# Patient Record
Sex: Male | Born: 1970 | Race: Black or African American | Hispanic: No | Smoking: Current every day smoker
Health system: Southern US, Community
[De-identification: ages and names within clinical notes are randomized; demographics above are authoritative.]

## PROBLEM LIST (undated history)

## (undated) DIAGNOSIS — M545 Low back pain: Secondary | ICD-10-CM

## (undated) DIAGNOSIS — B353 Tinea pedis: Secondary | ICD-10-CM

## (undated) DIAGNOSIS — R7989 Other specified abnormal findings of blood chemistry: Secondary | ICD-10-CM

## (undated) DIAGNOSIS — I10 Essential (primary) hypertension: Secondary | ICD-10-CM

## (undated) DIAGNOSIS — K625 Hemorrhage of anus and rectum: Secondary | ICD-10-CM

## (undated) DIAGNOSIS — F329 Major depressive disorder, single episode, unspecified: Secondary | ICD-10-CM

## (undated) DIAGNOSIS — F32A Depression, unspecified: Secondary | ICD-10-CM

## (undated) DIAGNOSIS — B20 Human immunodeficiency virus [HIV] disease: Secondary | ICD-10-CM

## (undated) DIAGNOSIS — R509 Fever, unspecified: Principal | ICD-10-CM

## (undated) DIAGNOSIS — H538 Other visual disturbances: Secondary | ICD-10-CM

## (undated) DIAGNOSIS — Z21 Asymptomatic human immunodeficiency virus [HIV] infection status: Secondary | ICD-10-CM

## (undated) HISTORY — DX: Asymptomatic human immunodeficiency virus (hiv) infection status: Z21

## (undated) HISTORY — DX: Tinea pedis: B35.3

## (undated) HISTORY — DX: Hemorrhage of anus and rectum: K62.5

## (undated) HISTORY — DX: Depression, unspecified: F32.A

## (undated) HISTORY — DX: Low back pain: M54.5

## (undated) HISTORY — DX: Fever, unspecified: R50.9

## (undated) HISTORY — DX: Human immunodeficiency virus (HIV) disease: B20

## (undated) HISTORY — DX: Essential (primary) hypertension: I10

## (undated) HISTORY — DX: Major depressive disorder, single episode, unspecified: F32.9

## (undated) HISTORY — DX: Other specified abnormal findings of blood chemistry: R79.89

## (undated) HISTORY — DX: Other visual disturbances: H53.8

---

## 1998-07-19 ENCOUNTER — Encounter: Payer: Self-pay | Admitting: Emergency Medicine

## 1998-07-19 ENCOUNTER — Emergency Department (HOSPITAL_COMMUNITY): Admission: EM | Admit: 1998-07-19 | Discharge: 1998-07-19 | Payer: Self-pay | Admitting: Emergency Medicine

## 2000-08-17 ENCOUNTER — Emergency Department (HOSPITAL_COMMUNITY): Admission: EM | Admit: 2000-08-17 | Discharge: 2000-08-17 | Payer: Self-pay | Admitting: Emergency Medicine

## 2001-01-10 ENCOUNTER — Emergency Department (HOSPITAL_COMMUNITY): Admission: EM | Admit: 2001-01-10 | Discharge: 2001-01-10 | Payer: Self-pay | Admitting: Emergency Medicine

## 2002-11-03 ENCOUNTER — Encounter: Payer: Self-pay | Admitting: Emergency Medicine

## 2002-11-03 ENCOUNTER — Emergency Department (HOSPITAL_COMMUNITY): Admission: EM | Admit: 2002-11-03 | Discharge: 2002-11-03 | Payer: Self-pay | Admitting: Emergency Medicine

## 2002-11-14 ENCOUNTER — Emergency Department (HOSPITAL_COMMUNITY): Admission: EM | Admit: 2002-11-14 | Discharge: 2002-11-14 | Payer: Self-pay | Admitting: Emergency Medicine

## 2004-02-16 DIAGNOSIS — B029 Zoster without complications: Secondary | ICD-10-CM | POA: Insufficient documentation

## 2004-02-28 ENCOUNTER — Encounter: Admission: RE | Admit: 2004-02-28 | Discharge: 2004-02-28 | Payer: Self-pay | Admitting: Internal Medicine

## 2004-08-18 DIAGNOSIS — I319 Disease of pericardium, unspecified: Secondary | ICD-10-CM | POA: Insufficient documentation

## 2004-10-20 ENCOUNTER — Ambulatory Visit (HOSPITAL_COMMUNITY): Admission: RE | Admit: 2004-10-20 | Discharge: 2004-10-20 | Payer: Self-pay | Admitting: Internal Medicine

## 2004-10-20 ENCOUNTER — Ambulatory Visit: Payer: Self-pay | Admitting: Internal Medicine

## 2004-10-20 ENCOUNTER — Encounter (INDEPENDENT_AMBULATORY_CARE_PROVIDER_SITE_OTHER): Payer: Self-pay | Admitting: *Deleted

## 2004-10-20 LAB — CONVERTED CEMR LAB
CD4 Count: 200 microliters
CD4 T Cell Abs: 200

## 2004-10-22 ENCOUNTER — Ambulatory Visit: Payer: Self-pay | Admitting: Internal Medicine

## 2004-10-29 ENCOUNTER — Ambulatory Visit: Payer: Self-pay | Admitting: Internal Medicine

## 2004-11-05 ENCOUNTER — Ambulatory Visit: Payer: Self-pay | Admitting: Infectious Diseases

## 2004-11-09 ENCOUNTER — Ambulatory Visit: Payer: Self-pay | Admitting: Internal Medicine

## 2004-12-02 ENCOUNTER — Inpatient Hospital Stay (HOSPITAL_COMMUNITY): Admission: AD | Admit: 2004-12-02 | Discharge: 2004-12-03 | Payer: Self-pay | Admitting: Internal Medicine

## 2004-12-02 ENCOUNTER — Ambulatory Visit: Payer: Self-pay | Admitting: Internal Medicine

## 2004-12-03 ENCOUNTER — Encounter (INDEPENDENT_AMBULATORY_CARE_PROVIDER_SITE_OTHER): Payer: Self-pay | Admitting: *Deleted

## 2004-12-03 ENCOUNTER — Encounter (INDEPENDENT_AMBULATORY_CARE_PROVIDER_SITE_OTHER): Payer: Self-pay | Admitting: Interventional Radiology

## 2004-12-21 ENCOUNTER — Ambulatory Visit: Payer: Self-pay | Admitting: Internal Medicine

## 2004-12-24 ENCOUNTER — Ambulatory Visit: Payer: Self-pay

## 2005-01-04 ENCOUNTER — Ambulatory Visit: Payer: Self-pay | Admitting: Infectious Diseases

## 2005-01-08 ENCOUNTER — Ambulatory Visit: Payer: Self-pay | Admitting: Infectious Diseases

## 2005-01-12 ENCOUNTER — Ambulatory Visit: Payer: Self-pay | Admitting: Infectious Diseases

## 2005-01-12 ENCOUNTER — Encounter (INDEPENDENT_AMBULATORY_CARE_PROVIDER_SITE_OTHER): Payer: Self-pay | Admitting: *Deleted

## 2005-01-12 LAB — CONVERTED CEMR LAB: HIV 1 RNA Quant: 25665 copies/mL

## 2005-01-19 ENCOUNTER — Ambulatory Visit: Payer: Self-pay | Admitting: Infectious Diseases

## 2005-01-22 ENCOUNTER — Ambulatory Visit: Payer: Self-pay | Admitting: Internal Medicine

## 2005-01-22 ENCOUNTER — Ambulatory Visit: Payer: Self-pay | Admitting: Infectious Diseases

## 2005-01-27 ENCOUNTER — Ambulatory Visit: Payer: Self-pay | Admitting: Infectious Diseases

## 2005-02-03 ENCOUNTER — Ambulatory Visit: Payer: Self-pay | Admitting: Infectious Diseases

## 2005-02-11 ENCOUNTER — Ambulatory Visit (HOSPITAL_COMMUNITY): Admission: RE | Admit: 2005-02-11 | Discharge: 2005-02-11 | Payer: Self-pay | Admitting: Infectious Diseases

## 2005-02-11 ENCOUNTER — Ambulatory Visit: Payer: Self-pay | Admitting: Infectious Diseases

## 2005-03-03 ENCOUNTER — Ambulatory Visit: Payer: Self-pay | Admitting: Infectious Diseases

## 2005-04-07 ENCOUNTER — Ambulatory Visit: Payer: Self-pay | Admitting: Infectious Diseases

## 2005-04-09 ENCOUNTER — Ambulatory Visit (HOSPITAL_COMMUNITY): Admission: RE | Admit: 2005-04-09 | Discharge: 2005-04-09 | Payer: Self-pay | Admitting: Infectious Diseases

## 2005-04-26 ENCOUNTER — Ambulatory Visit: Payer: Self-pay | Admitting: Infectious Diseases

## 2005-06-29 ENCOUNTER — Ambulatory Visit: Payer: Self-pay | Admitting: Infectious Diseases

## 2005-07-08 ENCOUNTER — Ambulatory Visit: Payer: Self-pay | Admitting: Infectious Diseases

## 2005-09-20 ENCOUNTER — Ambulatory Visit: Payer: Self-pay | Admitting: Infectious Diseases

## 2005-11-08 ENCOUNTER — Ambulatory Visit: Payer: Self-pay | Admitting: Infectious Diseases

## 2005-12-14 ENCOUNTER — Ambulatory Visit: Payer: Self-pay | Admitting: Infectious Diseases

## 2005-12-20 ENCOUNTER — Ambulatory Visit (HOSPITAL_COMMUNITY): Admission: RE | Admit: 2005-12-20 | Discharge: 2005-12-20 | Payer: Self-pay | Admitting: Infectious Diseases

## 2005-12-24 ENCOUNTER — Ambulatory Visit: Payer: Self-pay | Admitting: Infectious Diseases

## 2006-02-24 ENCOUNTER — Ambulatory Visit: Payer: Self-pay | Admitting: Infectious Diseases

## 2006-03-02 ENCOUNTER — Ambulatory Visit: Payer: Self-pay | Admitting: Infectious Diseases

## 2006-05-31 ENCOUNTER — Encounter (INDEPENDENT_AMBULATORY_CARE_PROVIDER_SITE_OTHER): Payer: Self-pay | Admitting: *Deleted

## 2006-05-31 ENCOUNTER — Ambulatory Visit: Payer: Self-pay | Admitting: Infectious Diseases

## 2006-06-30 ENCOUNTER — Ambulatory Visit: Payer: Self-pay | Admitting: Infectious Diseases

## 2006-08-22 ENCOUNTER — Encounter (INDEPENDENT_AMBULATORY_CARE_PROVIDER_SITE_OTHER): Payer: Self-pay | Admitting: *Deleted

## 2006-08-22 ENCOUNTER — Ambulatory Visit: Payer: Self-pay | Admitting: Infectious Diseases

## 2006-08-22 LAB — CONVERTED CEMR LAB
ALT: 32 units/L (ref 0–53)
BUN: 14 mg/dL (ref 6–23)
CD4 Count: 400 microliters
CO2: 25 meq/L (ref 19–32)
Cholesterol: 214 mg/dL — ABNORMAL HIGH (ref 0–200)
Creatinine, Ser: 0.89 mg/dL (ref 0.40–1.50)
Glucose, Bld: 94 mg/dL (ref 70–99)
HDL: 74 mg/dL (ref >39)
Indirect Bilirubin: 0.5 mg/dL (ref 0.0–0.9)
LDL Cholesterol: 118 mg/dL — ABNORMAL HIGH (ref 0–99)
Potassium: 4.1 meq/L (ref 3.5–5.3)
Triglycerides: 112 mg/dL (ref <150)

## 2006-11-08 DIAGNOSIS — L2089 Other atopic dermatitis: Secondary | ICD-10-CM

## 2006-11-08 DIAGNOSIS — R599 Enlarged lymph nodes, unspecified: Secondary | ICD-10-CM | POA: Insufficient documentation

## 2006-11-08 DIAGNOSIS — J329 Chronic sinusitis, unspecified: Secondary | ICD-10-CM | POA: Insufficient documentation

## 2006-11-08 DIAGNOSIS — B20 Human immunodeficiency virus [HIV] disease: Secondary | ICD-10-CM

## 2006-11-08 DIAGNOSIS — B159 Hepatitis A without hepatic coma: Secondary | ICD-10-CM | POA: Insufficient documentation

## 2006-11-08 DIAGNOSIS — D649 Anemia, unspecified: Secondary | ICD-10-CM

## 2006-11-17 ENCOUNTER — Ambulatory Visit: Payer: Self-pay | Admitting: Infectious Diseases

## 2006-11-17 ENCOUNTER — Encounter: Payer: Self-pay | Admitting: Infectious Diseases

## 2006-11-17 LAB — CONVERTED CEMR LAB
ALT: 97 units/L — ABNORMAL HIGH (ref 0–53)
AST: 66 units/L — ABNORMAL HIGH (ref 0–37)
Alkaline Phosphatase: 214 units/L — ABNORMAL HIGH (ref 39–117)
Bilirubin Urine: NEGATIVE
Bilirubin, Direct: 0.1 mg/dL (ref 0.0–0.3)
Calcium: 8.6 mg/dL (ref 8.4–10.5)
Chloride: 103 meq/L (ref 96–112)
Cholesterol: 228 mg/dL — ABNORMAL HIGH (ref 0–200)
Creatinine, Urine: 182.8 mg/dL
Glucose, Bld: 104 mg/dL — ABNORMAL HIGH (ref 70–99)
Hemoglobin, Urine: NEGATIVE
Hep A Total Ab: POSITIVE — AB
Indirect Bilirubin: 0.3 mg/dL (ref 0.0–0.9)
Ketones, ur: NEGATIVE mg/dL
LDL Cholesterol: 125 mg/dL — ABNORMAL HIGH (ref 0–99)
Nitrite: NEGATIVE
Sodium: 140 meq/L (ref 135–145)
Specific Gravity, Urine: 1.018 (ref 1.005–1.03)
Total Bilirubin: 0.4 mg/dL (ref 0.3–1.2)
Total CHOL/HDL Ratio: 3
Total Protein: 8.1 g/dL (ref 6.0–8.3)
Urine Glucose: NEGATIVE mg/dL
VLDL: 27 mg/dL (ref 0–40)

## 2006-12-12 ENCOUNTER — Encounter (INDEPENDENT_AMBULATORY_CARE_PROVIDER_SITE_OTHER): Payer: Self-pay | Admitting: *Deleted

## 2006-12-12 ENCOUNTER — Encounter: Payer: Self-pay | Admitting: Infectious Diseases

## 2006-12-12 LAB — CONVERTED CEMR LAB

## 2006-12-25 ENCOUNTER — Encounter (INDEPENDENT_AMBULATORY_CARE_PROVIDER_SITE_OTHER): Payer: Self-pay | Admitting: *Deleted

## 2007-02-07 ENCOUNTER — Ambulatory Visit: Payer: Self-pay | Admitting: Infectious Diseases

## 2007-03-15 ENCOUNTER — Ambulatory Visit: Payer: Self-pay | Admitting: Internal Medicine

## 2007-05-02 ENCOUNTER — Encounter: Payer: Self-pay | Admitting: Infectious Diseases

## 2007-05-02 ENCOUNTER — Ambulatory Visit: Payer: Self-pay | Admitting: Infectious Diseases

## 2007-05-02 LAB — CONVERTED CEMR LAB
ALT: 43 units/L (ref 0–53)
AST: 24 units/L (ref 0–37)
Bilirubin, Direct: 0.1 mg/dL (ref 0.0–0.3)
CO2: 21 meq/L (ref 19–32)
Creatinine, Ser: 0.78 mg/dL (ref 0.40–1.50)
Glucose, Bld: 94 mg/dL (ref 70–99)
HDL: 56 mg/dL (ref >39)
Hemoglobin, Urine: NEGATIVE
Indirect Bilirubin: 0.6 mg/dL (ref 0.0–0.9)
Ketones, ur: NEGATIVE mg/dL
Leukocytes, UA: NEGATIVE
Phosphorus: 3.2 mg/dL (ref 2.3–4.6)
Potassium: 3.9 meq/L (ref 3.5–5.3)
Protein, ur: NEGATIVE mg/dL
Total Protein: 7.6 g/dL (ref 6.0–8.3)
Urine Glucose: NEGATIVE mg/dL

## 2007-07-27 ENCOUNTER — Ambulatory Visit: Payer: Self-pay | Admitting: Infectious Diseases

## 2007-10-04 ENCOUNTER — Ambulatory Visit: Payer: Self-pay | Admitting: Infectious Diseases

## 2007-10-06 ENCOUNTER — Encounter: Payer: Self-pay | Admitting: Infectious Diseases

## 2008-01-08 ENCOUNTER — Ambulatory Visit: Payer: Self-pay | Admitting: Infectious Diseases

## 2008-02-29 ENCOUNTER — Ambulatory Visit: Payer: Self-pay | Admitting: Infectious Diseases

## 2008-04-08 ENCOUNTER — Ambulatory Visit: Payer: Self-pay | Admitting: Infectious Diseases

## 2008-06-28 ENCOUNTER — Ambulatory Visit: Payer: Self-pay | Admitting: Infectious Diseases

## 2008-07-04 ENCOUNTER — Ambulatory Visit: Payer: Self-pay | Admitting: Infectious Diseases

## 2008-07-05 ENCOUNTER — Encounter (INDEPENDENT_AMBULATORY_CARE_PROVIDER_SITE_OTHER): Payer: Self-pay | Admitting: *Deleted

## 2008-07-24 ENCOUNTER — Encounter (INDEPENDENT_AMBULATORY_CARE_PROVIDER_SITE_OTHER): Payer: Self-pay | Admitting: *Deleted

## 2008-07-29 ENCOUNTER — Telehealth (INDEPENDENT_AMBULATORY_CARE_PROVIDER_SITE_OTHER): Payer: Self-pay | Admitting: *Deleted

## 2008-08-27 ENCOUNTER — Telehealth (INDEPENDENT_AMBULATORY_CARE_PROVIDER_SITE_OTHER): Payer: Self-pay | Admitting: *Deleted

## 2008-09-16 ENCOUNTER — Ambulatory Visit: Payer: Self-pay | Admitting: Infectious Diseases

## 2008-09-23 ENCOUNTER — Telehealth (INDEPENDENT_AMBULATORY_CARE_PROVIDER_SITE_OTHER): Payer: Self-pay | Admitting: *Deleted

## 2008-10-24 ENCOUNTER — Telehealth (INDEPENDENT_AMBULATORY_CARE_PROVIDER_SITE_OTHER): Payer: Self-pay | Admitting: *Deleted

## 2008-11-20 ENCOUNTER — Telehealth: Payer: Self-pay | Admitting: *Deleted

## 2008-11-27 ENCOUNTER — Telehealth (INDEPENDENT_AMBULATORY_CARE_PROVIDER_SITE_OTHER): Payer: Self-pay | Admitting: *Deleted

## 2008-12-12 ENCOUNTER — Ambulatory Visit: Payer: Self-pay | Admitting: Infectious Diseases

## 2008-12-24 ENCOUNTER — Encounter: Payer: Self-pay | Admitting: Infectious Diseases

## 2008-12-24 ENCOUNTER — Ambulatory Visit: Payer: Self-pay | Admitting: Infectious Diseases

## 2008-12-24 LAB — CONVERTED CEMR LAB
ALT: 52 units/L (ref 0–53)
AST: 39 units/L — ABNORMAL HIGH (ref 0–37)
CD4 Count: 389 microliters
Chloride: 109 meq/L (ref 96–112)
Creatinine, Ser: 0.83 mg/dL (ref 0.40–1.50)
HIV 1 RNA Quant: 39 copies/mL
Potassium: 4 meq/L (ref 3.5–5.3)
Sodium: 142 meq/L (ref 135–145)
Total CHOL/HDL Ratio: 3.1
Total Protein: 7.3 g/dL (ref 6.0–8.3)
Triglycerides: 95 mg/dL (ref <150)

## 2008-12-26 ENCOUNTER — Telehealth (INDEPENDENT_AMBULATORY_CARE_PROVIDER_SITE_OTHER): Payer: Self-pay | Admitting: *Deleted

## 2009-01-08 ENCOUNTER — Encounter (INDEPENDENT_AMBULATORY_CARE_PROVIDER_SITE_OTHER): Payer: Self-pay | Admitting: *Deleted

## 2009-02-13 ENCOUNTER — Telehealth (INDEPENDENT_AMBULATORY_CARE_PROVIDER_SITE_OTHER): Payer: Self-pay | Admitting: *Deleted

## 2009-03-13 ENCOUNTER — Ambulatory Visit: Payer: Self-pay | Admitting: Infectious Diseases

## 2009-03-13 LAB — CONVERTED CEMR LAB
ALT: 39 units/L (ref 0–53)
AST: 27 units/L (ref 0–37)
Albumin: 4.2 g/dL (ref 3.5–5.2)
BUN: 12 mg/dL (ref 6–23)
Basophils Relative: 1 % (ref 0–1)
CO2: 20 meq/L (ref 19–32)
Cholesterol: 217 mg/dL — ABNORMAL HIGH (ref 0–200)
Eosinophils Relative: 4 % (ref 0–5)
Glucose, Bld: 92 mg/dL (ref 70–99)
LDL Cholesterol: 112 mg/dL — ABNORMAL HIGH (ref 0–99)
MCHC: 35 g/dL (ref 30.0–36.0)
Monocytes Absolute: 0.5 10*3/uL (ref 0.1–1.0)
Neutro Abs: 2.2 10*3/uL (ref 1.7–7.7)
Neutrophils Relative %: 37 % — ABNORMAL LOW (ref 43–77)
Potassium: 4 meq/L (ref 3.5–5.3)
RDW: 12.5 % (ref 11.5–15.5)
Total Bilirubin: 0.4 mg/dL (ref 0.3–1.2)
Total Protein: 7.6 g/dL (ref 6.0–8.3)
Triglycerides: 256 mg/dL — ABNORMAL HIGH (ref <150)
VLDL: 51 mg/dL — ABNORMAL HIGH (ref 0–40)

## 2009-03-21 ENCOUNTER — Telehealth (INDEPENDENT_AMBULATORY_CARE_PROVIDER_SITE_OTHER): Payer: Self-pay | Admitting: *Deleted

## 2009-04-18 ENCOUNTER — Telehealth (INDEPENDENT_AMBULATORY_CARE_PROVIDER_SITE_OTHER): Payer: Self-pay | Admitting: *Deleted

## 2009-05-15 ENCOUNTER — Telehealth (INDEPENDENT_AMBULATORY_CARE_PROVIDER_SITE_OTHER): Payer: Self-pay | Admitting: *Deleted

## 2009-06-13 ENCOUNTER — Telehealth (INDEPENDENT_AMBULATORY_CARE_PROVIDER_SITE_OTHER): Payer: Self-pay | Admitting: *Deleted

## 2009-07-14 ENCOUNTER — Telehealth (INDEPENDENT_AMBULATORY_CARE_PROVIDER_SITE_OTHER): Payer: Self-pay | Admitting: *Deleted

## 2009-07-22 ENCOUNTER — Encounter: Payer: Self-pay | Admitting: Infectious Diseases

## 2009-07-22 ENCOUNTER — Ambulatory Visit: Payer: Self-pay | Admitting: Infectious Diseases

## 2009-07-22 LAB — CONVERTED CEMR LAB
ALT: 31 units/L (ref 0–53)
AST: 23 units/L (ref 0–37)
Alkaline Phosphatase: 124 units/L — ABNORMAL HIGH (ref 39–117)
BUN: 17 mg/dL (ref 6–23)
CO2: 19 meq/L (ref 19–32)
Creatinine, Ser: 0.91 mg/dL (ref 0.40–1.50)
Creatinine, Urine: 161.4 mg/dL
HIV 1 RNA Quant: 39 copies/mL
Total Bilirubin: 0.3 mg/dL (ref 0.3–1.2)
Total Protein, Urine: 8
Total Protein: 7.7 g/dL (ref 6.0–8.3)

## 2009-07-23 ENCOUNTER — Encounter: Payer: Self-pay | Admitting: Infectious Diseases

## 2009-07-23 LAB — CONVERTED CEMR LAB: Direct LDL: 123 mg/dL — ABNORMAL HIGH

## 2009-08-05 ENCOUNTER — Ambulatory Visit: Payer: Self-pay | Admitting: Internal Medicine

## 2009-08-11 ENCOUNTER — Telehealth (INDEPENDENT_AMBULATORY_CARE_PROVIDER_SITE_OTHER): Payer: Self-pay | Admitting: *Deleted

## 2009-08-12 ENCOUNTER — Encounter (INDEPENDENT_AMBULATORY_CARE_PROVIDER_SITE_OTHER): Payer: Self-pay | Admitting: *Deleted

## 2009-11-05 ENCOUNTER — Ambulatory Visit: Payer: Self-pay | Admitting: Infectious Diseases

## 2009-11-05 LAB — CONVERTED CEMR LAB
AST: 32 units/L (ref 0–37)
Albumin: 4.4 g/dL (ref 3.5–5.2)
Alkaline Phosphatase: 127 units/L — ABNORMAL HIGH (ref 39–117)
HIV 1 RNA Quant: 39 copies/mL
LDL Cholesterol: 136 mg/dL — ABNORMAL HIGH (ref 0–99)
Potassium: 4 meq/L (ref 3.5–5.3)
Sodium: 137 meq/L (ref 135–145)
Total Bilirubin: 0.6 mg/dL (ref 0.3–1.2)
Total Protein: 7.7 g/dL (ref 6.0–8.3)
VLDL: 38 mg/dL (ref 0–40)

## 2010-03-24 ENCOUNTER — Ambulatory Visit: Payer: Self-pay | Admitting: Internal Medicine

## 2010-03-24 ENCOUNTER — Ambulatory Visit: Payer: Self-pay | Admitting: Infectious Diseases

## 2010-03-24 LAB — CONVERTED CEMR LAB
ALT: 68 units/L — ABNORMAL HIGH (ref 0–53)
AST: 67 units/L — ABNORMAL HIGH (ref 0–37)
Alkaline Phosphatase: 156 units/L — ABNORMAL HIGH (ref 39–117)
Basophils Relative: 0 % (ref 0–1)
CO2: 18 meq/L — ABNORMAL LOW (ref 19–32)
Creatinine, Ser: 0.76 mg/dL (ref 0.40–1.50)
Eosinophils Absolute: 0.2 10*3/uL (ref 0.0–0.7)
HIV 1 RNA Quant: <48 copies/mL (ref <48)
HIV-1 RNA Quant, Log: <1.68 (ref <1.68)
LDL Cholesterol: 131 mg/dL — ABNORMAL HIGH (ref 0–99)
Lymphs Abs: 2.3 10*3/uL (ref 0.7–4.0)
MCHC: 34.8 g/dL (ref 30.0–36.0)
MCV: 98.4 fL (ref 78.0–100.0)
Monocytes Relative: 11 % (ref 3–12)
Neutro Abs: 1.1 10*3/uL — ABNORMAL LOW (ref 1.7–7.7)
Neutrophils Relative %: 28 % — ABNORMAL LOW (ref 43–77)
Platelets: 247 10*3/uL (ref 150–400)
RBC: 4.35 M/uL (ref 4.22–5.81)
Sodium: 138 meq/L (ref 135–145)
Total Bilirubin: 0.4 mg/dL (ref 0.3–1.2)
Total CHOL/HDL Ratio: 4.1
Total Protein: 7.9 g/dL (ref 6.0–8.3)
VLDL: 53 mg/dL — ABNORMAL HIGH (ref 0–40)
WBC: 4.1 10*3/uL (ref 4.0–10.5)

## 2010-04-13 ENCOUNTER — Encounter (INDEPENDENT_AMBULATORY_CARE_PROVIDER_SITE_OTHER): Payer: Self-pay | Admitting: *Deleted

## 2010-04-14 ENCOUNTER — Telehealth (INDEPENDENT_AMBULATORY_CARE_PROVIDER_SITE_OTHER): Payer: Self-pay | Admitting: *Deleted

## 2010-07-22 ENCOUNTER — Ambulatory Visit: Payer: Self-pay | Admitting: Infectious Diseases

## 2010-07-22 ENCOUNTER — Ambulatory Visit: Payer: Self-pay | Admitting: Internal Medicine

## 2010-07-22 LAB — CONVERTED CEMR LAB
ALT: 34 units/L (ref 0–53)
AST: 25 units/L (ref 0–37)
Alkaline Phosphatase: 108 units/L (ref 39–117)
CO2: 24 meq/L (ref 19–32)
Cholesterol: 214 mg/dL — ABNORMAL HIGH (ref 0–200)
Creatinine, Ser: 0.89 mg/dL (ref 0.40–1.50)
Hep B Core Total Ab: NEGATIVE
LDL Cholesterol: 128 mg/dL — ABNORMAL HIGH (ref 0–99)
Sodium: 139 meq/L (ref 135–145)
Total Bilirubin: 0.5 mg/dL (ref 0.3–1.2)
Total CHOL/HDL Ratio: 3.7
Total Protein, Urine: 6
Total Protein: 7.3 g/dL (ref 6.0–8.3)
VLDL: 28 mg/dL (ref 0–40)

## 2010-07-24 ENCOUNTER — Ambulatory Visit: Payer: Self-pay | Admitting: Internal Medicine

## 2010-07-24 DIAGNOSIS — K921 Melena: Secondary | ICD-10-CM | POA: Insufficient documentation

## 2010-09-08 ENCOUNTER — Encounter (INDEPENDENT_AMBULATORY_CARE_PROVIDER_SITE_OTHER): Payer: Self-pay | Admitting: *Deleted

## 2010-10-28 ENCOUNTER — Ambulatory Visit
Admission: RE | Admit: 2010-10-28 | Discharge: 2010-10-28 | Payer: Self-pay | Source: Home / Self Care | Attending: Infectious Diseases | Admitting: Infectious Diseases

## 2010-10-28 ENCOUNTER — Encounter: Payer: Self-pay | Admitting: Infectious Diseases

## 2010-10-28 LAB — CONVERTED CEMR LAB
ALT: 36 U/L (ref 0–53)
AST: 30 U/L (ref 0–37)
Albumin: 4.6 g/dL (ref 3.5–5.2)
Alkaline Phosphatase: 138 U/L — ABNORMAL HIGH (ref 39–117)
BUN: 11 mg/dL (ref 6–23)
CD4 Count: 596 uL
CO2: 25 meq/L (ref 19–32)
Calcium: 9.5 mg/dL (ref 8.4–10.5)
Chloride: 105 meq/L (ref 96–112)
Cholesterol: 212 mg/dL — ABNORMAL HIGH (ref 0–200)
Creatinine, Ser: 0.88 mg/dL (ref 0.40–1.50)
Glucose, Bld: 99 mg/dL (ref 70–99)
HDL: 58 mg/dL (ref >39)
HIV 1 RNA Quant: 39 {copies}/mL
LDL Cholesterol: 124 mg/dL — ABNORMAL HIGH (ref 0–99)
Potassium: 4 meq/L (ref 3.5–5.3)
Sodium: 140 meq/L (ref 135–145)
Total Bilirubin: 0.5 mg/dL (ref 0.3–1.2)
Total CHOL/HDL Ratio: 3.7
Total Protein: 7.5 g/dL (ref 6.0–8.3)
Triglycerides: 149 mg/dL (ref <150)
VLDL: 30 mg/dL (ref 0–40)

## 2010-11-15 LAB — CONVERTED CEMR LAB
ALT: 35 U/L
ALT: 39 U/L
ALT: 45 U/L
ALT: 51 units/L (ref 0–53)
AST: 20 U/L
AST: 27 units/L (ref 0–37)
AST: 30 U/L
AST: 37 U/L
AST: 37 units/L (ref 0–37)
Albumin: 4 g/dL (ref 3.5–5.2)
Albumin: 4.2 g/dL (ref 3.5–5.2)
Albumin: 4.3 g/dL
Albumin: 4.3 g/dL
Albumin: 4.5 g/dL
Alkaline Phosphatase: 109 units/L (ref 39–117)
Alkaline Phosphatase: 111 units/L (ref 39–117)
Alkaline Phosphatase: 115 U/L
Alkaline Phosphatase: 128 U/L — ABNORMAL HIGH
Alkaline Phosphatase: 136 U/L — ABNORMAL HIGH
BUN: 11 mg/dL
BUN: 11 mg/dL
BUN: 11 mg/dL (ref 6–23)
BUN: 16 mg/dL
Bilirubin Urine: NEGATIVE
Bilirubin Urine: NEGATIVE
Bilirubin, Direct: 0.1 mg/dL
Bilirubin, Direct: 0.1 mg/dL
Bilirubin, Direct: 0.2 mg/dL (ref 0.0–0.3)
Bilirubin, Direct: <0.1 mg/dL
CD4 Count: 344 uL
CD4 Count: 432 uL
CD4 Count: 443 microliters
CD4 Count: 444 uL
CD4 Count: 486 uL
CD4 Count: 578 uL
CO2: 21 meq/L (ref 19–32)
CO2: 23 meq/L
CO2: 23 meq/L
CO2: 23 meq/L
Calcium: 8.7 mg/dL (ref 8.4–10.5)
Calcium: 8.8 mg/dL (ref 8.4–10.5)
Calcium: 8.9 mg/dL
Calcium: 9.1 mg/dL
Calcium: 9.2 mg/dL
Calcium: 9.3 mg/dL (ref 8.4–10.5)
Chloride: 104 meq/L
Chloride: 104 meq/L (ref 96–112)
Chloride: 105 meq/L
Chloride: 105 meq/L (ref 96–112)
Chloride: 107 meq/L
Chloride: 107 meq/L (ref 96–112)
Cholesterol: 221 mg/dL — ABNORMAL HIGH
Cholesterol: 222 mg/dL — ABNORMAL HIGH (ref 0–200)
Cholesterol: 228 mg/dL — ABNORMAL HIGH
Cholesterol: 230 mg/dL — ABNORMAL HIGH
Creatinine, Ser: 0.72 mg/dL
Creatinine, Ser: 0.8 mg/dL (ref 0.40–1.50)
Creatinine, Ser: 0.89 mg/dL
Creatinine, Ser: 0.89 mg/dL
Creatinine, Ser: 0.92 mg/dL (ref 0.40–1.50)
Creatinine, Urine: 102.9 mg/dL
Creatinine, Urine: 158.8 mg/dL
Glucose, Bld: 101 mg/dL — ABNORMAL HIGH
Glucose, Bld: 90 mg/dL (ref 70–99)
Glucose, Bld: 91 mg/dL (ref 70–99)
Glucose, Bld: 92 mg/dL (ref 70–99)
Glucose, Bld: 96 mg/dL
Glucose, Bld: 96 mg/dL
HCV Ab: NEGATIVE
HDL: 40 mg/dL
HDL: 61 mg/dL (ref >39)
HDL: 64 mg/dL
HDL: 70 mg/dL
HIV 1 RNA Quant: 39 {copies}/mL
HIV 1 RNA Quant: 49 {copies}/mL
HIV 1 RNA Quant: 49 {copies}/mL
HIV 1 RNA Quant: 49 {copies}/mL
HIV 1 RNA Quant: 49 {copies}/mL
Hemoglobin, Urine: NEGATIVE
Hep B Core Total Ab: NEGATIVE
Hepatitis B Surface Ag: NEGATIVE
Indirect Bilirubin: 0.3 mg/dL (ref 0.0–0.9)
Indirect Bilirubin: 0.4 mg/dL
Indirect Bilirubin: 0.4 mg/dL (ref 0.0–0.9)
Indirect Bilirubin: 0.5 mg/dL
Indirect Bilirubin: 0.5 mg/dL (ref 0.0–0.9)
Ketones, ur: NEGATIVE mg/dL
LDL Cholesterol: 115 mg/dL — ABNORMAL HIGH (ref 0–99)
LDL Cholesterol: 130 mg/dL — ABNORMAL HIGH
LDL Cholesterol: 142 mg/dL — ABNORMAL HIGH
LDL Cholesterol: 143 mg/dL — ABNORMAL HIGH
Leukocytes, UA: NEGATIVE
Leukocytes, UA: NEGATIVE
Nitrite: NEGATIVE
Nitrite: NEGATIVE
Phosphorus: 3.3 mg/dL
Phosphorus: 3.5 mg/dL
Potassium: 3.9 meq/L (ref 3.5–5.3)
Potassium: 4.1 meq/L (ref 3.5–5.3)
Potassium: 4.2 meq/L
Potassium: 4.2 meq/L
Potassium: 4.2 meq/L
Protein, ur: NEGATIVE mg/dL
Protein, ur: NEGATIVE mg/dL
Sodium: 139 meq/L (ref 135–145)
Sodium: 140 meq/L
Sodium: 140 meq/L
Sodium: 142 meq/L
Sodium: 142 meq/L (ref 135–145)
Specific Gravity, Urine: 1.018
Total Bilirubin: 0.3 mg/dL
Total Bilirubin: 0.4 mg/dL (ref 0.3–1.2)
Total Bilirubin: 0.5 mg/dL
Total Bilirubin: 0.6 mg/dL
Total Bilirubin: 0.6 mg/dL (ref 0.3–1.2)
Total Bilirubin: 0.6 mg/dL (ref 0.3–1.2)
Total CHOL/HDL Ratio: 3.2
Total CHOL/HDL Ratio: 3.3
Total CHOL/HDL Ratio: 3.5
Total CHOL/HDL Ratio: 3.6
Total CHOL/HDL Ratio: 3.6
Total CHOL/HDL Ratio: 3.7
Total CHOL/HDL Ratio: 5.8
Total Protein, Urine: 3
Total Protein, Urine: 5
Total Protein: 7.6 g/dL
Total Protein: 7.8 g/dL
Total Protein: 7.9 g/dL (ref 6.0–8.3)
Total Protein: 8.1 g/dL
Triglycerides: 104 mg/dL
Triglycerides: 105 mg/dL
Triglycerides: 239 mg/dL — ABNORMAL HIGH
Urine Glucose: NEGATIVE mg/dL
Urobilinogen, UA: 0.2
Urobilinogen, UA: 0.2 (ref 0.0–1.0)
VLDL: 21 mg/dL
VLDL: 21 mg/dL
VLDL: 22 mg/dL (ref 0–40)
VLDL: 48 mg/dL — ABNORMAL HIGH
pH: 6

## 2010-11-17 NOTE — Miscellaneous (Signed)
  Clinical Lists Changes  Observations: Added new observation of YEARAIDSPOS: 2006  (09/08/2010 15:49) Added new observation of HIV STATUS: CDC-defined AIDS  (09/08/2010 15:49)

## 2010-11-17 NOTE — Assessment & Plan Note (Signed)
Summary: sick? per kim [mkj]   CC:  pt. c/o blood in stool x 1 week and black itcy spots bilateral legs.  History of Present Illness: Pt c/o blood after a bowel movement when he wipes and in the toilet bowel. He has no pain.  It has been going on for the past 2 weeks. No constipation.  He had a similar episdode a few months ago. No history of hemorrhoids.  He also has a puritic rash on his left leg. He has not tried any cream on it.  Preventive Screening-Counseling & Management  Alcohol-Tobacco     Alcohol drinks/day: <1     Alcohol type: beer-socially     Smoking Status: current     Packs/Day: 0.5     Passive Smoke Exposure: yes  Caffeine-Diet-Exercise     Caffeine use/day: 3 a week     Does Patient Exercise: no  Safety-Violence-Falls     Seat Belt Use: 100      Sexual History:  none.        Drug Use:  never.    Comments: pt. declined condoms   Updated Prior Medication List: SUSTIVA 600 MG TABS (EFAVIRENZ) 1 by mouth nightly on an empty stomach TRUVADA 200-300 MG TABS (EMTRICITABINE-TENOFOVIR) Take 1 tablet by mouth once a day Need office visit ACYCLOVIR 400 MG TABS (ACYCLOVIR) take 2 and 1/2 tablets as a single dos for early labial tingling of lips TRIAMCINOLONE ACETONIDE 0.1 % CREA (TRIAMCINOLONE ACETONIDE) apply two times a day ANUSOL-HC 25 MG SUPP (HYDROCORTISONE ACETATE) one per rectum every day  Current Allergies (reviewed today): No known allergies  Past History:  Past Medical History: Last updated: 11/08/2006 Atopic dermatitis Chronic Sinusitis Pericarditis-08/2004 Enlarged lymph nodes-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    Vital Signs:  Patient profile:   40 year old male Height:      69 inches (175.26 cm) Weight:       202.12 pounds (91.87 kg) BMI:     29.96 Temp:     98.7 degrees F (37.06 degrees C) oral Pulse rate:   94 / minute BP sitting:   146 / 86  (right arm)  Vitals Entered By: Wendall Mola CMA Duncan Dull) (July 24, 2010 3:37 PM) CC: pt. c/o blood in stool x 1 week, black itcy spots bilateral legs Is Patient Diabetic? No Pain Assessment Patient in pain? no      Nutritional Status BMI of 25 - 29 = overweight Nutritional Status Detail appetite "very good"  Does patient need assistance? Functional Status Self care Ambulation Normal Comments no missed doses of meds per pt.   Physical Exam  General:  alert and well-developed.   Head:  normocephalic and atraumatic.   Rectal:  no fissures and external hemorrhoid(s).   Skin:  several excoriated areason lower right leg   Impression & Recommendations:  Problem # 1:  BLOOD IN STOOL (ICD-578.1) anuaol supp increase fiber and fluids if no response - call Orders: Est. Patient Level III (19147)  Problem # 2:  DERMATITIS, ATOPIC (ICD-691.8)  His updated medication list for this problem includes:    Triamcinolone Acetonide 0.1 % Crea (Triamcinolone acetonide) .Marland Kitchen... Apply two times a day  Medications Added to Medication List This Visit: 1)  Triamcinolone Acetonide 0.1 % Crea (Triamcinolone acetonide) .Marland KitchenMarland KitchenMarland Kitchen  Apply two times a day 2)  Anusol-hc 25 Mg Supp (Hydrocortisone acetate) .... One per rectum every day Prescriptions: ANUSOL-HC 25 MG SUPP (HYDROCORTISONE ACETATE) one per rectum every day  #10 x 0   Entered and Authorized by:   Yisroel Ramming MD   Signed by:   Yisroel Ramming MD on 07/24/2010   Method used:   Print then Give to Patient   RxID:   [id number] TRIAMCINOLONE ACETONIDE 0.1 % CREA (TRIAMCINOLONE ACETONIDE) apply two times a day  #60gm x 0   Entered and Authorized by:   Yisroel Ramming MD   Signed by:   Yisroel Ramming MD on 07/24/2010   Method used:   Print then Give to Patient   RxID:   [id number]

## 2010-11-17 NOTE — Assessment & Plan Note (Signed)
   Process Orders Check Orders Results:     [facility]: ABN not required for this insurance Order queued for requisitioning for [facility]: March 24, 2010 11:22 AM  Tests Sent for requisitioning (March 24, 2010 11:22 AM):     03/24/2010: [facility] -- T-HIV Viral Load [phone] (signed)

## 2010-11-17 NOTE — Miscellaneous (Signed)
Summary: New scripts to [facility] pharmacy  Clinical Lists Changes  Medications: Rx of SUSTIVA 600 MG TABS (EFAVIRENZ) 1 by mouth nightly on an empty stomach;  #30 x 11;  Signed;  Entered by: Paulo Fruit  BS,CPht II,MPH;  Authorized by: Lina Sayre MD;  Method used: Electronically to [facility] [phone]*, [address] [place], [place]  [address], Ph: [phone], Fax:  Rx of TRUVADA 200-300 MG TABS (EMTRICITABINE-TENOFOVIR) Take 1 tablet by mouth once a day Need office visit;  #30 x 11;  Signed;  Entered by: Paulo Fruit  BS,CPht II,MPH;  Authorized by: Lina Sayre MD;  Method used: Electronically to [facility] [phone]*, [address], [place], [place]  [address], Ph: [phone], Fax: Observations: Added new observation of AIDSDAP: Yes 2011 (04/13/2010 9:26)    Prescriptions: TRUVADA 200-300 MG TABS (EMTRICITABINE-TENOFOVIR) Take 1 tablet by mouth once a day Need office visit  #30 x 11   Entered by:   Paulo Fruit  BS,CPht II,MPH   Authorized by:   Lina Sayre MD   Signed by:   Paulo Fruit  BS,CPht II,MPH on 04/13/2010   Method used:   Electronically to        [facility] [phone]* (retail)       [address]       [place], [place]  [address]       Ph: [phone]       Fax:    RxID:   [id number] SUSTIVA 600 MG TABS (EFAVIRENZ) 1 by mouth nightly on an empty stomach  #30 x 11   Entered by:   Paulo Fruit  BS,CPht II,MPH   Authorized by:   Lina Sayre MD   Signed by:   Paulo Fruit  BS,CPht II,MPH on 04/13/2010   Method used:   Electronically to        [facility] [phone]* (retail)       [address]       [place], [place]  [address]       Ph: [phone]       Fax:    RxID:   [id number]  Paulo Fruit  BS,CPht II,MPH  April 13, 2010 9:27 AM

## 2010-11-17 NOTE — Assessment & Plan Note (Signed)
Summary: STUDY APPT/ LH    Current Allergies: No known allergies  Vital Signs:  Patient profile:   40 year old male Height:      69 inches (175.26 cm) Weight:      205.75 pounds (93.52 kg) BMI:     30.49 Temp:     98.5 degrees F oral Pulse rate:   78 / minute BP sitting:   131 / 84  (left arm) Is Patient Diabetic? No Pain Assessment Patient in pain? no      Nutritional Status BMI of > 30 = obese  Does patient need assistance? Functional Status Self care Ambulation Normal   Patient here for week 288 ALLRT study visit. The past week he has been having bright red blood in his stools and he is very concerned. He has not been having any abd pain, rectal itching or other signs of a GI bleed. He does take aleve and aspirin once in a while. He also has a rash on both shins and a spot on his rt arm that he says he has had a long time but he is concerned about it, raised dark patches.  He is also concerned about weight gain. We did discuss diet and exercise plans. He will return in 2 days to see Dr. Philipp Deputy re: the above problems. He had a prescription that had run out of refills that Dr. Maurice March had given him afor acyclovir, which I had refilled at his pharmacy. Deirdre Evener RN  July 22, 2010 11:07 AM    Medications Added to Medication List This Visit: 1)  Acyclovir 400 Mg Tabs (Acyclovir) .... Take 2 and 1/2 tablets as a single dos for early labial tingling of lips  Other Orders: Est. Patient Research Study [id number]) T-Comprehensive Metabolic Panel [id number]) T-Lipid Profile [phone]) T-Urine Protein [id number]) T-Urine Creatinine [id number]) T-Hepatitis B Core Antibody ([id number]) T-Hepatitis C Antibody ([id number]) T-Hepatitis B Surface Antigen ([id number]) T-Hepatitis A Antibody (60630-16010) Prescriptions: ACYCLOVIR 400 MG TABS (ACYCLOVIR) take 2 and 1/2 tablets as a single dos for early labial tingling of lips  #3 x prn   Entered by:   Deirdre Evener RN  Authorized by:   Lina Sayre MD   Signed by:   Deirdre Evener RN on 07/22/2010   Method used:   Electronically to        [facility]  [phone]* (retail)       [address]       [place], [place]  [address]       Ph: [phone] or [phone]       Fax: [phone]   RxID:   [id number]

## 2010-11-17 NOTE — Assessment & Plan Note (Signed)
Summary: STUDY APPT/ LH    Current Allergies: No known allergies  Social History: Tobacco Use:  no  Vital Signs:  Patient profile:   40 year old male Weight:      200.6 pounds (91.18 kg) BMI:     31.53 Temp:     97.9 degrees F oral Pulse rate:   80 / minute BP sitting:   120 / 82  (left arm) Is Patient Diabetic? No Research Study Name: ALLRT Pain Assessment Patient in pain? no      Nutritional Status BMI of 25 - 29 = overweight  Does patient need assistance? Functional Status Self care Ambulation Normal   Patient here for week 256 ALLRT study visit.  He continues to c/o numbness in his fingers bilat. He had bright red blood in his stools off and on for 1 month in November. I told him if he noticed it anymore to call us to be seen. He denied having hemmorhoids or straining with his BMs. Deirdre Evener RN  November 05, 2009 4:32 PM   Complete Medication List: 1)  Zyrtec 10 Mg Tabs (Cetirizine hcl) .... Take 1 tablet by mouth once a day 2)  Sustiva 600 Mg Tabs (Efavirenz) .Marland Kitchen.. 1 by mouth nightly on an empty stomach 3)  Truvada 200-300 Mg Tabs (Emtricitabine-tenofovir) .... Take 1 tablet by mouth once a day need office visit  Other Orders: Est. Patient Research Study [phone]) T-Comprehensive Metabolic Panel [id number]) T-Lipid Profile [phone]) Process Orders Check Orders Results:     [facility]: ABN not required for this insurance Tests Sent for requisitioning (December 08, 2009 2:58 PM):     11/05/2009: [facility] -- T-Comprehensive Metabolic Panel [[id number]] (signed)     11/05/2009: [facility] -- T-Lipid Profile [phone] (signed)

## 2010-11-17 NOTE — Assessment & Plan Note (Signed)
Summary: STUDY APPT/ LH    Current Allergies: No known allergies  Vital Signs:  Patient profile:   40 year old male Weight:      195.8 pounds (89 kg) BMI:     30.78 Temp:     98.2 degrees F oral Pulse rate:   88 / minute BP sitting:   118 / 80  (left arm) Is Patient Diabetic? No Pain Assessment Patient in pain? no      Nutritional Status BMI of > 30 = obese  Does patient need assistance? Functional Status Self care Ambulation Normal   Patient here for ALLRT study visit. He has a new enlarged lymph node under his rt ear. He is worried about his ADAP not being approved and not getting his meds. Apparently there is a hold up with his application and he doesn't understand what info they want from him. He has been taking his ARVs every other day to make them last. I instructed him to not do that, because it would cause resistance to develop. I told him to take them everyday until he runs out and that he would be okay off meds for a few months until we could get his ADAP straightened out. I will contact the ADAP office in [facility] and see what the issue is with his approval.Kim Epperson RN  March 24, 2010 1:59 PM   Other Orders: Est. Patient Research Study [id number]) T-CBC w/Diff [id number]) T-Comprehensive Metabolic Panel [phone] [id number]) T-Lipid Profile [phone])  Process Orders Check Orders Results:     [facility]: ABN not required for this insurance Tests Sent for requisitioning (March 24, 2010 1:51 PM):     03/24/2010: [facility] -- T-CBC w/Diff [[id number]] (signed)     03/24/2010: [facility] -- T-Comprehensive Metabolic Panel [[id number]] (signed)     03/24/2010: [facility] -- T-Lipid Profile [id number] (signed)

## 2010-11-17 NOTE — Miscellaneous (Signed)
Summary: HIV-1 RNA, CD4 (RESEARCH)  Clinical Lists Changes  Observations: Added new observation of CD4 COUNT: 545 microliters (11/05/2009 16:22) Added new observation of HIV1RNA QA: 39 copies/mL (11/05/2009 16:22)

## 2010-11-17 NOTE — Progress Notes (Signed)
Summary: [facility]/pt assist meds arrived for Jun  Phone Note Refill Request      Prescriptions: TRUVADA 200-300 MG TABS (EMTRICITABINE-TENOFOVIR) Take 1 tablet by mouth once a day Need office visit  #30 x 0   Entered by:   Paulo Fruit  BS,CPht II,MPH   Authorized by:   Lina Sayre MD   Signed by:   Paulo Fruit  BS,CPht II,MPH on 04/14/2010   Method used:   Samples Given   RxID:   [id number] SUSTIVA 600 MG TABS (EFAVIRENZ) 1 by mouth nightly on an empty stomach  #30 x 0   Entered by:   Paulo Fruit  BS,CPht II,MPH   Authorized by:   Lina Sayre MD   Signed by:   Paulo Fruit  BS,CPht II,MPH on 04/14/2010   Method used:   Samples Given   RxID:   [id number]  Patient Assist Medication Verification: Medication name:Truvada RX # [id number] Tech approval:MLD  Patient Assist Medication Verification: Medication name:Sustiva 600mg  RX # [id number] Tech approval:MLD Call placed to patient with message that assistance medications are ready for pick-up. Left message on patient's voicemail Paulo Fruit  BS,CPht II,MPH  April 14, 2010 10:52 AM

## 2010-11-17 NOTE — Miscellaneous (Signed)
Summary: HIV-1 RNA, CD4 (RESEARCH)  Clinical Lists Changes  Observations: Added new observation of CD4 COUNT: 529 microliters (07/22/2010 10:37) Added new observation of HIV1RNA QA: 39 copies/mL (07/22/2010 10:37)

## 2010-11-17 NOTE — Assessment & Plan Note (Signed)
Summary: FLU SHOT  Prior Medications: ZYRTEC 10 MG TABS (CETIRIZINE HCL) Take 1 tablet by mouth once a day SUSTIVA 600 MG TABS (EFAVIRENZ) 1 by mouth nightly on an empty stomach TRUVADA 200-300 MG TABS (EMTRICITABINE-TENOFOVIR) Take 1 tablet by mouth once a day Need office visit Current Allergies: No known allergies  Immunizations Administered:  Influenza Vaccine # 1:    Vaccine Type: Fluvax Non-MCR    Site: right deltoid    Mfr: novartis    Dose: 0.5 ml    Route: IM    Given by: Deirdre Evener RN    Exp. Date: 01/31/2011    Lot #:  [id number]    VIS given: 05/12/10 version given July 22, 2010.  Flu Vaccine Consent Questions:    Do you have a history of severe allergic reactions to this vaccine? no    Any prior history of allergic reactions to egg and/or gelatin? no    Do you have a sensitivity to the preservative Thimersol? no    Do you have a past history of Guillan-Barre Syndrome? no    Do you currently have an acute febrile illness? no    Have you ever had a severe reaction to latex? no    Vaccine information given and explained to patient? yes  Orders Added: 1)  Influenza Vaccine NON MCR [00028]

## 2010-11-19 NOTE — Miscellaneous (Signed)
Summary: HIV-1 RNA, CD4 (RESEARCH)  Clinical Lists Changes  Observations: Added new observation of CD4 COUNT: 596 microliters (10/28/2010 14:28) Added new observation of HIV1RNA QA: 39 copies/mL (10/28/2010 14:28)

## 2010-12-11 ENCOUNTER — Encounter (INDEPENDENT_AMBULATORY_CARE_PROVIDER_SITE_OTHER): Payer: Self-pay | Admitting: *Deleted

## 2010-12-14 ENCOUNTER — Encounter (INDEPENDENT_AMBULATORY_CARE_PROVIDER_SITE_OTHER): Payer: Self-pay | Admitting: *Deleted

## 2010-12-15 NOTE — Miscellaneous (Signed)
  Clinical Lists Changes  Observations: Added new observation of PCTFPL: 119.15  (12/11/2010 16:00) Added new observation of HOUSEINCOME: 16109  (12/11/2010 16:00)

## 2010-12-15 NOTE — Miscellaneous (Signed)
  Clinical Lists Changes  Observations: Added new observation of AIDSDAP: 2012 PENDING APPROVAL (12/11/2010 15:27) Added new observation of PCTFPL: 82.36  (12/11/2010 15:27) Added new observation of HOUSEINCOME: 8920  (12/11/2010 15:27) Added new observation of FINASSESSDT: 12/11/2010  (12/11/2010 15:27)

## 2010-12-24 NOTE — Assessment & Plan Note (Signed)
Summary: STUDY APPT/ LH    Current Allergies: No known allergies   Other Orders: Est. Patient Research Study (04200) T-Comprehensive Metabolic Panel ([id number]) T-Lipid Profile ([id number]) 

## 2010-12-24 NOTE — Miscellaneous (Signed)
Summary: ADAP / RW update  Clinical Lists Changes  Observations: Added new observation of PCTFPL: 102.95  (12/14/2010 17:04) Added new observation of HOUSEINCOME: 04540  (12/14/2010 17:04)

## 2010-12-31 ENCOUNTER — Encounter (INDEPENDENT_AMBULATORY_CARE_PROVIDER_SITE_OTHER): Payer: Self-pay | Admitting: *Deleted

## 2011-01-04 LAB — T-HELPER CELL (CD4) - (RCID CLINIC ONLY)
CD4 % Helper T Cell: 25 % — ABNORMAL LOW (ref 33–55)
CD4 T Cell Abs: 590 uL (ref 400–2700)

## 2011-01-05 NOTE — Miscellaneous (Signed)
Summary: adap approved til 07/18/11  Clinical Lists Changes  Observations: Added new observation of AIDSDAP: Yes 2012 (12/31/2010 17:38)

## 2011-03-05 NOTE — Discharge Summary (Signed)
[name], [name]                ACCOUNT NO.:  [id number]   MEDICAL RECORD NO.:  [id number]          PATIENT TYPE:  INP   LOCATION:  5703                         FACILITY:  [facility]   PHYSICIAN:  Madaline Guthrie, M.D.    DATE OF BIRTH:  02-22-1971   DATE OF ADMISSION:  12/02/2004  DATE OF DISCHARGE:  12/03/2004                                 DISCHARGE SUMMARY   DISCHARGE DIAGNOSIS:  1.  Generalized lymphadenopathy including cervical, mediastinal, inguinal      regions status post ultrasound guided needle biopsy of the cervical      lymph node pending results.  2.  HIV AIDS with last CD4 count 200, viral load 15,000 checked in January      2006.  3.  History of acute pericardial tamponade status post pericardial centesis      in November 2005 with pericardial window and fluid positive for      malignant cells and atypical lymphocytes.  4.  History of acute hepatitis.  5.  Elevated liver enzymes.  6.  Anemia of chronic disease.  7.  History of Herpes zoster post herpetic neuralgia.  8.  History of chronic sinusitis.   DISCHARGE MEDICATIONS:  Bactrim 1 tablet p.o. daily, Benadryl 12.5 mg p.o.  q.6h. p.r.n., Protonix 40 mg p.o. daily, Nasonex spray once daily,  acetaminophen 650 mg p.o. q.8h. p.r.n., Phenergan 25 mg p.o. q.8h. p.r.n.,  Ambien 5 mg p.o. q.h.s. p.r.n.   DISPOSITION:  The patient has been discharged home in fair condition after  thorough workup including CT scan imaging of head, neck, abdomen, and  pelvis, and ultrasound guided needle biopsy of cervical lymph node with  further workup pending.  This needs to be followed up as an outpatient here.  He will follow up with me in [facility] on March 6 at 9 a.m.  He also needs to  follow up with Dr. Jeanelle Malling, his primary care physician who will be  following on his workup and further management regarding this issue.   CONSULTATIONS:  No consultations were done during this admission.   PROCEDURE:  Ultrasound guided needle  aspiration of the cervical lymph node  was done on December 03, 2004, by interventional radiology.   IMAGING:  Extensive imaging done including CT scan of the head showing  negative, cranial CT with no evidence of mass lesions or bleed.  CT scan of  the neck showed diffuse neck adenopathy with prominent lymphoid tissue and  large nodes on the right side.  CT scan of the chest showing axillary,  supraclavicular, mediastinal, and hilar lymph nodes, small pericardial  effusion, but no acute primary infiltrate.  CT of the abdomen showed small  scattered mesenteric and retroperitoneal nodes but no gross adenopathy.  Liver and spleen within normal limits.  CT scan of the pelvis showed no  adenopathy in the pelvic area, small inguinal lymph node noted.   ADMISSION HISTORY:  [name] is a 40 year old African American male with a  past medical history significant for last CD4 count 200 which was checked in  January 2006 who presented to the  clinic for his usual regular clinic  appointment with Dr. Jeanelle Malling and had complaints of fever, night sweats,  malaise, and increased fatigue since the past two months.  He also had  enlarged cervical lymphadenopathy bilaterally in the posterior triangle of  the neck.  Of note, he has a significant history in the past, November 2005,  when he was admitted to [facility] with complaints of  shortness of breath.  At that time, CT scan was done to rule out PE which  was negative for PE but did show mediastinal lymphadenopathy and pericardial  effusion.  Echocardiogram was done which showed early pericardial tamponade.  Further lab work was done by pericardial centesis and window.  Pericardial  fluid was sent for labs which showed atypical lymphocytes and questionable  malignant effusion.  At that time, he was scheduled for lymph node biopsy  for further workup but he left AMA without getting further workup done.   Admission vital signs  revealed pulse 124, blood pressure 130/75, temperature  97.2, respiratory rate 18, O2 saturations 100% on room air.  General  examination revealed he was in no acute distress.  Eyes:  PERRLA,  extraocular movements intact.  ENT:  Oropharynx clear, no thrush.  Neck:  Supple, bilateral postauricular fullness, lymphadenopathy in the posterior  triangle of the neck, 2 by 2 cm palpable lymph node in the inferior  auricular region.  Lungs clear to auscultation, no wheezing or rhonchi  noted.  CVS:  Normal S1 and S2, regular rate and rhythm.  Abdomen:  Soft,  nontender, nondistended abdomen, no hepatosplenomegaly noted.  Extremities:  No cyanosis or edema.  Skin:  Diffuse healing macular papular rash noted in  different areas including upper thighs, lower abdomen, which was healing.  Lymphadenopathy small 1 by 1 lymph node palpable in the right inguinal area  and a few small nodes also palpable in the axillary area bilaterally.  Neurological:  Alert, awake, oriented x 3, cranial nerves intact, no focal  deficits, slightly flat affect, otherwise, within normal limits.   ADMISSION LABORATORY DATA:  WBC 4.3, hemoglobin 11.7, platelet count 252.  BMP with sodium 131, potassium 3.3, chloride 101, CO2 24, glucose 96, BUN 3,  creatinine 0.7, total bilirubin 1.6, alkaline phos 204, AST 50, ALT 67,  total protein 7.9, albumin 3.2, calcium 8.5.  PT 13.9, INR 1.1, PTT 29.  LDH  194.  Serum  uric acid 5.4.  Urinalysis negative.  TSH 1.130.  Blood  cultures x 2 no growth to date.  Last CD4 count done on October 20, 2004, was  200.   HOSPITAL COURSE:  Problem 1:  Lymphadenopathy.  Given his past medical history and the patient  not being on HIV medicines, and significant history in the recent past of  his pericardial tamponade, and pericardial centesis fluid positive for  malignant effusion and atypical cells, we were concerned about non-Hodgkins lymphoma at this point.  Given his status, other differential  diagnoses were  also considered including tuberculosis, MAC, histoplasmosis,  coccidioidomycosis, lymphoma, Kaposi's sarcoma, or secondary malignant  effusion.  Extensive imaging was done including CT scan of the head, neck,  chest, and abdomen, which showed generalized lymphadenopathy significant in  the cervical area.  Ultrasound guided lymph node biopsy was done for further  evaluation.  Further evaluation and management during this regard would be  dependant on the biopsy result, will follow up the result as an outpatient.  PPB test was also done to rule out  TB, given the possibility of atypical  presentation in spite of absence of pulmonary lesion.   Problem 2:  Fever and night sweats.  Again, most likely could be secondary  to problem 1, most likely non-Hodgkins lymphoma, or could be presentation of  HIV infection, itself.  Will proceed with further workup.   Problem 3:  HIV.  Currently, the patient is not on HART, was never started  on HART medicine, given his multiple social issues including immigration  status and lack of insurance at this point, needs help regarding this  matter.  Will follow up during follow up at the clinic appointment.   Problem 4:  Elevated liver enzymes.  He has elevated alkaline phos at 200,  AST 40, ALT 54.  CT scan did not show any significant lesions in the liver.  He had a recent episode of infection.  He was also checked for hepatitis  panel, will follow up results.   Problem 5:  Anemia of chronic disease with fecal occult blood test negative.  RBC folate checked recently in January 2006 was normal at 446, B12 was  normal at 918, ferritin 373.  Recheck on follow up to not do further workup  at this point and since ultimately the workup for lymphadenopathy would be  helpful in this situation since he could have lymphoma infiltrating the bone  marrow which could cause the decreased hemoglobin.   Problem 6:  Chronic sinusitis, continue on Nasonex and  Benadryl for  symptomatic relief.   DISCHARGE LABORATORY DATA:  On the day of discharge, his CBC showed  hemoglobin 10.4, white count 4, thrombocyte count 225.  BMP showed sodium  135, potassium 3.7, chloride 107, CO2 26, creatinine 0.7, BUN 23, calcium  8.4, magnesium level normal at 2.1.      SY/MEDQ  D:  12/03/2004  T:  12/03/2004  Job:  [id number]

## 2011-03-09 ENCOUNTER — Ambulatory Visit (INDEPENDENT_AMBULATORY_CARE_PROVIDER_SITE_OTHER): Payer: Self-pay | Admitting: *Deleted

## 2011-03-09 VITALS — BP 117/80 | HR 79 | Temp 98.4°F | Wt 194.2 lb

## 2011-03-09 DIAGNOSIS — B20 Human immunodeficiency virus [HIV] disease: Secondary | ICD-10-CM

## 2011-03-09 LAB — COMPREHENSIVE METABOLIC PANEL
ALT: 61 U/L — ABNORMAL HIGH (ref 0–53)
BUN: 11 mg/dL (ref 6–23)
CO2: 21 mEq/L (ref 19–32)
Creat: 0.88 mg/dL (ref 0.40–1.50)
Glucose, Bld: 97 mg/dL (ref 70–99)
Total Bilirubin: 0.4 mg/dL (ref 0.3–1.2)

## 2011-03-09 LAB — LIPID PANEL
Cholesterol: 260 mg/dL — ABNORMAL HIGH (ref 0–200)
HDL: 64 mg/dL (ref >39)
Total CHOL/HDL Ratio: 4.1 Ratio
Triglycerides: 187 mg/dL — ABNORMAL HIGH (ref <150)
VLDL: 37 mg/dL (ref 0–40)

## 2011-03-09 LAB — CBC WITH DIFFERENTIAL/PLATELET
Eosinophils Absolute: 0.2 10*3/uL (ref 0.0–0.7)
Eosinophils Relative: 4 % (ref 0–5)
Lymphs Abs: 2.4 10*3/uL (ref 0.7–4.0)
MCH: 33.8 pg (ref 26.0–34.0)
MCV: 99.3 fL (ref 78.0–100.0)
Monocytes Absolute: 0.6 10*3/uL (ref 0.1–1.0)
Platelets: 247 10*3/uL (ref 150–400)
RBC: 4.29 MIL/uL (ref 4.22–5.81)

## 2011-03-09 NOTE — Progress Notes (Signed)
Patient here for week 320 ALLRT study visit. He reports an increase in stress related to immigration issues. They are trying to have him sent back to his home country of [place], where healthcare is lacking. He knows he will not be able to receive any care for his HIV there. He is working with an [employer] and thinks a letter from his medical provider stressing his need for treatment will help his case. He has made an appt to see Dr. Maurice March in June and he will talk with him then about it. He will return in October for his next research appointment.

## 2011-04-05 ENCOUNTER — Other Ambulatory Visit: Payer: Self-pay | Admitting: Infectious Diseases

## 2011-04-06 ENCOUNTER — Other Ambulatory Visit: Payer: Self-pay | Admitting: *Deleted

## 2011-04-06 DIAGNOSIS — B20 Human immunodeficiency virus [HIV] disease: Secondary | ICD-10-CM

## 2011-04-06 MED ORDER — EFAVIRENZ 600 MG PO TABS: 600.0000 mg | ORAL_TABLET | Freq: Every day | ORAL | Status: DC

## 2011-04-06 MED ORDER — EMTRICITABINE-TENOFOVIR DF 200-300 MG PO TABS: 1.0000 | ORAL_TABLET | Freq: Every day | ORAL | Status: DC

## 2011-04-22 ENCOUNTER — Ambulatory Visit: Payer: Self-pay | Admitting: Infectious Diseases

## 2011-04-22 ENCOUNTER — Encounter: Payer: Self-pay | Admitting: Infectious Diseases

## 2011-04-22 VITALS — BP 132/84 | HR 72 | Temp 98.1°F | Wt 193.8 lb

## 2011-04-22 DIAGNOSIS — B20 Human immunodeficiency virus [HIV] disease: Secondary | ICD-10-CM

## 2011-06-14 ENCOUNTER — Emergency Department (HOSPITAL_COMMUNITY)
Admission: EM | Admit: 2011-06-14 | Discharge: 2011-06-14 | Disposition: A | Discharge status: Home / Self Care | Payer: Self-pay | Attending: Emergency Medicine | Admitting: Emergency Medicine

## 2011-06-14 DIAGNOSIS — M545 Low back pain, unspecified: Secondary | ICD-10-CM | POA: Insufficient documentation

## 2011-06-14 DIAGNOSIS — IMO0001 Reserved for inherently not codable concepts without codable children: Secondary | ICD-10-CM | POA: Insufficient documentation

## 2011-06-14 DIAGNOSIS — M5380 Other specified dorsopathies, site unspecified: Secondary | ICD-10-CM | POA: Insufficient documentation

## 2011-06-17 ENCOUNTER — Ambulatory Visit: Payer: Self-pay

## 2011-06-25 ENCOUNTER — Ambulatory Visit: Payer: Self-pay

## 2011-07-02 ENCOUNTER — Ambulatory Visit: Payer: Self-pay

## 2011-07-20 ENCOUNTER — Ambulatory Visit: Payer: Self-pay | Admitting: *Deleted

## 2011-07-23 ENCOUNTER — Encounter: Payer: Self-pay | Admitting: *Deleted

## 2011-07-23 ENCOUNTER — Ambulatory Visit (INDEPENDENT_AMBULATORY_CARE_PROVIDER_SITE_OTHER): Payer: Self-pay | Admitting: *Deleted

## 2011-07-23 VITALS — BP 119/84 | HR 78 | Temp 98.4°F | Resp 16 | Wt 200.2 lb

## 2011-07-23 DIAGNOSIS — B20 Human immunodeficiency virus [HIV] disease: Secondary | ICD-10-CM

## 2011-07-23 LAB — LIPID PANEL: LDL Cholesterol: 125 mg/dL — ABNORMAL HIGH (ref 0–99)

## 2011-07-23 LAB — COMPREHENSIVE METABOLIC PANEL
ALT: 31 U/L (ref 0–53)
Albumin: 4.2 g/dL (ref 3.5–5.2)
CO2: 21 mEq/L (ref 19–32)
Chloride: 107 mEq/L (ref 96–112)
Potassium: 4 mEq/L (ref 3.5–5.3)
Sodium: 141 mEq/L (ref 135–145)
Total Bilirubin: 0.4 mg/dL (ref 0.3–1.2)
Total Protein: 7.4 g/dL (ref 6.0–8.3)

## 2011-07-23 NOTE — Progress Notes (Signed)
Patient here for week 336 ALLRT study visit. He currently has cold sx of nasal/chest congestion, runny nose, sore throat and cough. He said he was seen in the ED 2 months ago for back strain which has resolved. He is waiting to hear about his immigration status from the court system and is worried he may have to go back to [place]. He will return in January for the next visit.

## 2011-07-24 LAB — CREATININE, URINE, RANDOM: Creatinine, Urine: 147.9 mg/dL

## 2011-08-12 ENCOUNTER — Encounter: Payer: Self-pay | Admitting: Infectious Diseases

## 2011-08-12 LAB — CD4/CD8 (T-HELPER/T-SUPPRESSOR CELL): CD8: 1018

## 2011-10-26 ENCOUNTER — Ambulatory Visit (INDEPENDENT_AMBULATORY_CARE_PROVIDER_SITE_OTHER): Payer: Self-pay | Admitting: *Deleted

## 2011-10-26 VITALS — BP 132/85 | HR 89 | Temp 98.1°F | Wt 205.2 lb

## 2011-10-26 DIAGNOSIS — B20 Human immunodeficiency virus [HIV] disease: Secondary | ICD-10-CM

## 2011-10-26 LAB — COMPREHENSIVE METABOLIC PANEL
ALT: 43 U/L (ref 0–53)
Albumin: 4.1 g/dL (ref 3.5–5.2)
CO2: 22 mEq/L (ref 19–32)
Calcium: 9 mg/dL (ref 8.4–10.5)
Chloride: 105 mEq/L (ref 96–112)
Glucose, Bld: 128 mg/dL — ABNORMAL HIGH (ref 70–99)
Potassium: 3.9 mEq/L (ref 3.5–5.3)
Sodium: 140 mEq/L (ref 135–145)
Total Bilirubin: 0.5 mg/dL (ref 0.3–1.2)
Total Protein: 7.2 g/dL (ref 6.0–8.3)

## 2011-10-26 LAB — CD4/CD8 (T-HELPER/T-SUPPRESSOR CELL)
CD4%: 25.1
CD8 % Suppressor T Cell: 42.6
CD8: 1108

## 2011-10-26 LAB — LIPID PANEL
Cholesterol: 247 mg/dL — ABNORMAL HIGH (ref 0–200)
Triglycerides: 252 mg/dL — ABNORMAL HIGH (ref <150)

## 2011-10-26 NOTE — Progress Notes (Signed)
Pt. Here for week 352 Allrt study visit. He denies any new problems or concerns. He will return in April for the next research appt.

## 2011-11-12 ENCOUNTER — Encounter: Payer: Self-pay | Admitting: Infectious Diseases

## 2011-12-20 ENCOUNTER — Ambulatory Visit: Payer: Self-pay

## 2011-12-24 ENCOUNTER — Ambulatory Visit
Admission: RE | Admit: 2011-12-24 | Discharge: 2011-12-24 | Disposition: A | Discharge status: Home / Self Care | Payer: No Typology Code available for payment source | Source: Ambulatory Visit | Attending: Specialist | Admitting: Specialist

## 2011-12-24 ENCOUNTER — Other Ambulatory Visit: Payer: Self-pay | Admitting: Specialist

## 2011-12-24 DIAGNOSIS — B20 Human immunodeficiency virus [HIV] disease: Secondary | ICD-10-CM

## 2012-02-02 ENCOUNTER — Ambulatory Visit (INDEPENDENT_AMBULATORY_CARE_PROVIDER_SITE_OTHER): Payer: Self-pay | Admitting: *Deleted

## 2012-02-02 ENCOUNTER — Encounter: Payer: Self-pay | Admitting: *Deleted

## 2012-02-02 VITALS — BP 124/85 | HR 76 | Temp 98.6°F | Wt 203.5 lb

## 2012-02-02 DIAGNOSIS — B20 Human immunodeficiency virus [HIV] disease: Secondary | ICD-10-CM

## 2012-02-02 NOTE — Progress Notes (Signed)
Pt here for study A5001, week 368. Informed patient of study closure, and discussed letter to study participants. Answered any questions; Pt verbalized understanding and signed letter. Pt given copy of letter. He will have one more visit on study. Assessment is unchanged since last study visit. PT stated he met with the judge on 12/24/11 about immigration and spoke with him for 10-15 min. Pt has another appointment in 2 months where hopefully he will hear some good news. Pt stress level is the same as last visit d/t immigration issues. Fasting labs were drawn, vital signs are stable. Pt received $20.00 gift card for study visit. Next appointment scheduled for Wednesday, May 31, 2012 @ 10am. Tacey Heap RN

## 2012-02-03 LAB — CBC WITH DIFFERENTIAL/PLATELET
Basophils Absolute: 0 10*3/uL (ref 0.0–0.1)
Eosinophils Absolute: 0.2 10*3/uL (ref 0.0–0.7)
Eosinophils Relative: 4 % (ref 0–5)
Lymphocytes Relative: 54 % — ABNORMAL HIGH (ref 12–46)
Lymphs Abs: 2.7 10*3/uL (ref 0.7–4.0)
MCV: 99.5 fL (ref 78.0–100.0)
Neutrophils Relative %: 31 % — ABNORMAL LOW (ref 43–77)
Platelets: 233 10*3/uL (ref 150–400)
RBC: 4.3 MIL/uL (ref 4.22–5.81)
RDW: 12.7 % (ref 11.5–15.5)
WBC: 5 10*3/uL (ref 4.0–10.5)

## 2012-02-03 LAB — COMPREHENSIVE METABOLIC PANEL
Albumin: 4.1 g/dL (ref 3.5–5.2)
BUN: 12 mg/dL (ref 6–23)
CO2: 21 mEq/L (ref 19–32)
Glucose, Bld: 100 mg/dL — ABNORMAL HIGH (ref 70–99)
Potassium: 4.3 mEq/L (ref 3.5–5.3)
Sodium: 139 mEq/L (ref 135–145)
Total Protein: 7.4 g/dL (ref 6.0–8.3)

## 2012-02-03 LAB — LIPID PANEL
Cholesterol: 238 mg/dL — ABNORMAL HIGH (ref 0–200)
HDL: 50 mg/dL (ref >39)
Total CHOL/HDL Ratio: 4.8 Ratio

## 2012-03-23 ENCOUNTER — Telehealth: Payer: Self-pay | Admitting: *Deleted

## 2012-03-23 NOTE — Telephone Encounter (Signed)
I called & left a message asking him to call & make an appt

## 2012-03-30 NOTE — Telephone Encounter (Signed)
He has made an appt

## 2012-04-06 ENCOUNTER — Other Ambulatory Visit: Payer: Self-pay | Admitting: *Deleted

## 2012-04-06 DIAGNOSIS — B20 Human immunodeficiency virus [HIV] disease: Secondary | ICD-10-CM

## 2012-04-06 MED ORDER — EFAVIRENZ 600 MG PO TABS: 600.0000 mg | ORAL_TABLET | Freq: Every day | ORAL | Status: DC

## 2012-04-06 MED ORDER — EMTRICITABINE-TENOFOVIR DF 200-300 MG PO TABS: 1.0000 | ORAL_TABLET | Freq: Every day | ORAL | Status: DC

## 2012-04-12 ENCOUNTER — Other Ambulatory Visit: Payer: Self-pay | Admitting: Licensed Clinical Social Worker

## 2012-04-12 DIAGNOSIS — B20 Human immunodeficiency virus [HIV] disease: Secondary | ICD-10-CM

## 2012-04-12 MED ORDER — EFAVIRENZ 600 MG PO TABS: 600.0000 mg | ORAL_TABLET | Freq: Every day | ORAL | Status: DC

## 2012-04-12 MED ORDER — EMTRICITABINE-TENOFOVIR DF 200-300 MG PO TABS: 1.0000 | ORAL_TABLET | Freq: Every day | ORAL | Status: DC

## 2012-05-31 ENCOUNTER — Ambulatory Visit (INDEPENDENT_AMBULATORY_CARE_PROVIDER_SITE_OTHER): Payer: Self-pay | Admitting: *Deleted

## 2012-05-31 ENCOUNTER — Other Ambulatory Visit: Payer: Self-pay | Admitting: Infectious Disease

## 2012-05-31 ENCOUNTER — Encounter: Payer: Self-pay | Admitting: *Deleted

## 2012-05-31 VITALS — BP 115/80 | HR 79 | Temp 98.1°F | Ht 69.0 in | Wt 211.0 lb

## 2012-05-31 DIAGNOSIS — B20 Human immunodeficiency virus [HIV] disease: Secondary | ICD-10-CM

## 2012-05-31 LAB — COMPREHENSIVE METABOLIC PANEL
ALT: 21 U/L (ref 0–53)
Alkaline Phosphatase: 111 U/L (ref 39–117)
CO2: 21 mEq/L (ref 19–32)
Creat: 0.8 mg/dL (ref 0.50–1.35)
Glucose, Bld: 90 mg/dL (ref 70–99)
Total Bilirubin: 0.3 mg/dL (ref 0.3–1.2)

## 2012-05-31 LAB — LIPID PANEL
Cholesterol: 261 mg/dL — ABNORMAL HIGH (ref 0–200)
Total CHOL/HDL Ratio: 7.1 Ratio
Triglycerides: 828 mg/dL — ABNORMAL HIGH (ref <150)

## 2012-05-31 LAB — HIV-1 RNA QUANT-NO REFLEX-BLD: HIV-1 RNA Viral Load: <40

## 2012-05-31 LAB — CD4/CD8 (T-HELPER/T-SUPPRESSOR CELL)
CD4%: 28.7
CD8: 1460

## 2012-05-31 NOTE — Progress Notes (Signed)
Pt here for A5001 FINAL STUDY VISIT. Assessment is unchanged since last study visit. Fasting labs were drawn and vital signs remain stable. Pt received $20.00 gift card for study visit. Pt is eligible to enroll into A5322 HAILO study when it opens. Told him we would give him a call around October. Tacey Heap RN

## 2012-06-02 ENCOUNTER — Ambulatory Visit (INDEPENDENT_AMBULATORY_CARE_PROVIDER_SITE_OTHER): Payer: Self-pay | Admitting: Infectious Diseases

## 2012-06-02 ENCOUNTER — Other Ambulatory Visit: Payer: Self-pay | Admitting: *Deleted

## 2012-06-02 ENCOUNTER — Encounter: Payer: Self-pay | Admitting: Infectious Diseases

## 2012-06-02 VITALS — BP 144/92 | HR 83 | Temp 97.4°F | Ht 67.0 in | Wt 209.0 lb

## 2012-06-02 DIAGNOSIS — B20 Human immunodeficiency virus [HIV] disease: Secondary | ICD-10-CM

## 2012-06-02 DIAGNOSIS — Z23 Encounter for immunization: Secondary | ICD-10-CM

## 2012-06-02 NOTE — Progress Notes (Signed)
Patient ID: Hrishikesh Hoeg, male   DOB: [birth year], 41 y.o.   MRN: [id number] HIV f/u    Fulco has no complaints and is adherent to his Atripla. His HIV is ,20 copies, undectable, and CD4 is >500.  Exam BP 144/92  Pulse 83  Temp 97.4 F (36.3 C) (Oral)  Ht 5\' 7"  (1.702 m)  Wt 209 lb (94.802 kg)  BMI 32.73 kg/m2 No distress and is well nourished. No rashes except for eczema of right lower leg with pigmented rash from excoriation.. No adenpathy. Normal lung exam. Impression/plan Suppressed HIV infection and will continue Atripla and f/u 4 months Eczema left leg OTC topical steroid prn use Lina Sayre

## 2012-06-02 NOTE — Patient Instructions (Signed)
Please return in 4 mos with labs 2 weeks prior 

## 2012-06-09 ENCOUNTER — Ambulatory Visit: Payer: Self-pay

## 2012-08-10 ENCOUNTER — Encounter: Payer: Self-pay | Admitting: Infectious Diseases

## 2012-09-21 ENCOUNTER — Other Ambulatory Visit: Payer: Self-pay | Admitting: *Deleted

## 2012-09-21 ENCOUNTER — Other Ambulatory Visit (INDEPENDENT_AMBULATORY_CARE_PROVIDER_SITE_OTHER): Payer: Self-pay

## 2012-09-21 ENCOUNTER — Other Ambulatory Visit: Payer: Self-pay | Admitting: Internal Medicine

## 2012-09-21 DIAGNOSIS — B20 Human immunodeficiency virus [HIV] disease: Secondary | ICD-10-CM

## 2012-09-21 DIAGNOSIS — Z79899 Other long term (current) drug therapy: Secondary | ICD-10-CM

## 2012-09-21 DIAGNOSIS — Z113 Encounter for screening for infections with a predominantly sexual mode of transmission: Secondary | ICD-10-CM

## 2012-09-21 MED ORDER — ACYCLOVIR 400 MG PO TABS: 400.0000 mg | ORAL_TABLET | ORAL | Status: DC

## 2012-09-22 LAB — CBC WITH DIFFERENTIAL/PLATELET
Basophils Absolute: 0 10*3/uL (ref 0.0–0.1)
Eosinophils Absolute: 0.2 10*3/uL (ref 0.0–0.7)
Lymphocytes Relative: 55 % — ABNORMAL HIGH (ref 12–46)
Lymphs Abs: 2.8 10*3/uL (ref 0.7–4.0)
Neutrophils Relative %: 30 % — ABNORMAL LOW (ref 43–77)
Platelets: 242 10*3/uL (ref 150–400)
RBC: 4.1 MIL/uL — ABNORMAL LOW (ref 4.22–5.81)
RDW: 13 % (ref 11.5–15.5)
WBC: 5 10*3/uL (ref 4.0–10.5)

## 2012-09-22 LAB — COMPREHENSIVE METABOLIC PANEL
ALT: 51 U/L (ref 0–53)
AST: 37 U/L (ref 0–37)
CO2: 25 mEq/L (ref 19–32)
Sodium: 141 mEq/L (ref 135–145)
Total Bilirubin: 0.5 mg/dL (ref 0.3–1.2)
Total Protein: 6.9 g/dL (ref 6.0–8.3)

## 2012-09-22 LAB — RPR

## 2012-09-22 LAB — T-HELPER CELL (CD4) - (RCID CLINIC ONLY)
CD4 % Helper T Cell: 24 % — ABNORMAL LOW (ref 33–55)
CD4 T Cell Abs: 680 uL (ref 400–2700)

## 2012-09-25 LAB — HIV-1 RNA QUANT-NO REFLEX-BLD: HIV 1 RNA Quant: <20 copies/mL (ref <20)

## 2012-10-06 ENCOUNTER — Ambulatory Visit: Payer: Self-pay | Admitting: Infectious Diseases

## 2012-11-28 ENCOUNTER — Ambulatory Visit (INDEPENDENT_AMBULATORY_CARE_PROVIDER_SITE_OTHER): Payer: Self-pay | Admitting: *Deleted

## 2012-11-28 ENCOUNTER — Other Ambulatory Visit: Payer: Self-pay | Admitting: Infectious Disease

## 2012-11-28 VITALS — BP 124/85 | HR 78 | Temp 97.8°F | Resp 18 | Ht 68.0 in | Wt 211.2 lb

## 2012-11-28 DIAGNOSIS — B20 Human immunodeficiency virus [HIV] disease: Secondary | ICD-10-CM

## 2012-11-28 LAB — CBC WITH DIFFERENTIAL/PLATELET
Basophils Absolute: 0 10*3/uL (ref 0.0–0.1)
Eosinophils Relative: 4 % (ref 0–5)
Lymphocytes Relative: 57 % — ABNORMAL HIGH (ref 12–46)
Lymphs Abs: 2.9 10*3/uL (ref 0.7–4.0)
MCV: 95.7 fL (ref 78.0–100.0)
Neutro Abs: 1.6 10*3/uL — ABNORMAL LOW (ref 1.7–7.7)
Neutrophils Relative %: 31 % — ABNORMAL LOW (ref 43–77)
Platelets: 239 10*3/uL (ref 150–400)
RBC: 4.19 MIL/uL — ABNORMAL LOW (ref 4.22–5.81)
WBC: 5.1 10*3/uL (ref 4.0–10.5)

## 2012-11-29 ENCOUNTER — Other Ambulatory Visit: Payer: Self-pay | Admitting: *Deleted

## 2012-11-29 DIAGNOSIS — B20 Human immunodeficiency virus [HIV] disease: Secondary | ICD-10-CM

## 2012-11-29 DIAGNOSIS — Z21 Asymptomatic human immunodeficiency virus [HIV] infection status: Secondary | ICD-10-CM

## 2012-11-29 LAB — COMPREHENSIVE METABOLIC PANEL
ALT: 57 U/L — ABNORMAL HIGH (ref 0–53)
AST: 41 U/L — ABNORMAL HIGH (ref 0–37)
Albumin: 4.2 g/dL (ref 3.5–5.2)
CO2: 23 mEq/L (ref 19–32)
Calcium: 9.4 mg/dL (ref 8.4–10.5)
Chloride: 106 mEq/L (ref 96–112)
Creat: 0.77 mg/dL (ref 0.50–1.35)
Potassium: 3.8 mEq/L (ref 3.5–5.3)
Sodium: 138 mEq/L (ref 135–145)
Total Protein: 7.1 g/dL (ref 6.0–8.3)

## 2012-11-29 LAB — LDL CHOLESTEROL, DIRECT: Direct LDL: 111 mg/dL — ABNORMAL HIGH

## 2012-11-29 LAB — LIPID PANEL: Cholesterol: 268 mg/dL — ABNORMAL HIGH (ref 0–200)

## 2012-11-29 LAB — HEMOGLOBIN A1C
Hgb A1c MFr Bld: 5.8 % — ABNORMAL HIGH (ref <5.7)
Mean Plasma Glucose: 120 mg/dL — ABNORMAL HIGH (ref <117)

## 2012-11-29 LAB — PROTEIN, URINE, RANDOM: Total Protein, Urine: 9 mg/dL

## 2012-11-29 LAB — HEPATITIS C ANTIBODY: HCV Ab: NEGATIVE

## 2012-11-29 NOTE — Progress Notes (Signed)
Participant here for entry visit into A5322 HAILO study. Consent discussed and questions answered. He verbalized understanding and signed consent. Assessment unchanged since last A5001 study visit. Continues to adhere to his ARV regimen and currently not taking any other medications or has no new dx. Pt denies any new findings, problems, or symptoms. Fasting labs were obtained with no problems; VSS; Next visit will be in July/August 2014. Tacey Heap RN

## 2012-12-06 ENCOUNTER — Other Ambulatory Visit: Payer: Self-pay | Admitting: *Deleted

## 2012-12-06 NOTE — Progress Notes (Signed)
Pt Hep B surface antibody negative. Pt received vaccine series in 2007. Discussed the need for a booster with Dr. Daiva Eves. Verbal orders received to administer a double dose/booster of Hep B vaccine to patient. Tacey Heap RN

## 2013-01-09 ENCOUNTER — Ambulatory Visit: Payer: Self-pay

## 2013-02-22 ENCOUNTER — Other Ambulatory Visit (INDEPENDENT_AMBULATORY_CARE_PROVIDER_SITE_OTHER): Payer: Self-pay

## 2013-02-22 ENCOUNTER — Other Ambulatory Visit: Payer: Self-pay | Admitting: Internal Medicine

## 2013-02-22 DIAGNOSIS — B20 Human immunodeficiency virus [HIV] disease: Secondary | ICD-10-CM

## 2013-02-22 LAB — CBC WITH DIFFERENTIAL/PLATELET
Basophils Relative: 1 % (ref 0–1)
Eosinophils Absolute: 0.2 10*3/uL (ref 0.0–0.7)
Eosinophils Relative: 3 % (ref 0–5)
MCH: 33.6 pg (ref 26.0–34.0)
MCHC: 34.7 g/dL (ref 30.0–36.0)
Neutrophils Relative %: 29 % — ABNORMAL LOW (ref 43–77)
Platelets: 251 10*3/uL (ref 150–400)
RDW: 13.2 % (ref 11.5–15.5)

## 2013-02-23 LAB — COMPREHENSIVE METABOLIC PANEL
ALT: 76 U/L — ABNORMAL HIGH (ref 0–53)
Alkaline Phosphatase: 132 U/L — ABNORMAL HIGH (ref 39–117)
Creat: 0.91 mg/dL (ref 0.50–1.35)
Glucose, Bld: 93 mg/dL (ref 70–99)
Sodium: 139 mEq/L (ref 135–145)
Total Bilirubin: 0.4 mg/dL (ref 0.3–1.2)
Total Protein: 7.4 g/dL (ref 6.0–8.3)

## 2013-02-23 LAB — T-HELPER CELL (CD4) - (RCID CLINIC ONLY): CD4 % Helper T Cell: 20 % — ABNORMAL LOW (ref 33–55)

## 2013-02-25 LAB — HIV-1 RNA QUANT-NO REFLEX-BLD: HIV-1 RNA Quant, Log: <1.3 {Log} (ref <1.30)

## 2013-03-09 ENCOUNTER — Ambulatory Visit: Payer: Self-pay | Admitting: Infectious Diseases

## 2013-03-21 ENCOUNTER — Encounter: Payer: Self-pay | Admitting: Infectious Diseases

## 2013-03-21 ENCOUNTER — Ambulatory Visit (INDEPENDENT_AMBULATORY_CARE_PROVIDER_SITE_OTHER): Payer: Self-pay | Admitting: *Deleted

## 2013-03-21 ENCOUNTER — Ambulatory Visit (INDEPENDENT_AMBULATORY_CARE_PROVIDER_SITE_OTHER): Payer: Self-pay | Admitting: Infectious Diseases

## 2013-03-21 VITALS — BP 138/86 | HR 76 | Temp 98.4°F | Wt 204.0 lb

## 2013-03-21 DIAGNOSIS — B20 Human immunodeficiency virus [HIV] disease: Secondary | ICD-10-CM

## 2013-03-21 DIAGNOSIS — Z21 Asymptomatic human immunodeficiency virus [HIV] infection status: Secondary | ICD-10-CM

## 2013-03-21 NOTE — Progress Notes (Signed)
Patient ID: [name], male   DOB: 06/07/1971, 42 y.o.   MRN: [id number] HIV f/u   [name] is immigrant for [place] who has been under my HIV care for nearly 17 years. His current ARV regimen is Atripla and he has been adherent and nicely suppressed on this regimen and endorses good adherence. Most recent viral load is <20 RNA copies/ml and CD4 700. He has no complaints today. There is one confusing issue in his care that I note after he left today. 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 I note in record that he complained of having tingling in leg and has been on chronic oral acyclovir. This probalby can be stopped with some explantion and reassurance. He has tested negative for HCV and HBV in past but he has had mildly elevated LFTs over past several visits. He only sees me about every 10 months or so because of busy schedule. He is pleasant and hard working. Note he is under review by INS for immigration status.   Exam  BP 138/86  Pulse 76  Temp(Src) 98.4 F (36.9 C) (Oral)  Wt 204 lb (92.534 kg)  BMI 31.03 kg/m2 [name] is healthy and well developed. No rashes, no evidence for recurrance of left thigh VZV. No adenopathy.  I&P HIV   Well suppressed on atripla and will continue Hx VZV left thigh several years ago. Probably can D/c acylovir on his next visit with Dr. Daiva Eves in anticipation of my retirement. Mild elevation of LFTs   Will repeat HBV and HCV screening. [name]

## 2013-03-21 NOTE — Patient Instructions (Signed)
Keep up the good work. See Dr Daiva Eves as we discussed.

## 2013-03-22 LAB — HEPATITIS B SURFACE ANTIGEN: Hepatitis B Surface Ag: NEGATIVE

## 2013-03-22 LAB — HEPATITIS C ANTIBODY: HCV Ab: NEGATIVE

## 2013-03-22 NOTE — Progress Notes (Signed)
Patient here for screening visit for A5321. Informed consent obtained and all questions answered. He understands that this is voluntary and he can withdraw at any time.  He is also on A5322, the HAILO study. He denies any current problems except for being stressed out. He is worried about being deported and has a court date soon. He will return next Friday for entry.

## 2013-03-30 ENCOUNTER — Other Ambulatory Visit: Payer: Self-pay | Admitting: Infectious Disease

## 2013-03-30 ENCOUNTER — Ambulatory Visit (INDEPENDENT_AMBULATORY_CARE_PROVIDER_SITE_OTHER): Payer: Self-pay | Admitting: *Deleted

## 2013-03-30 VITALS — BP 127/88 | HR 78 | Temp 97.9°F | Resp 16 | Ht 68.0 in | Wt 207.0 lb

## 2013-03-30 DIAGNOSIS — Z21 Asymptomatic human immunodeficiency virus [HIV] infection status: Secondary | ICD-10-CM

## 2013-03-30 DIAGNOSIS — B20 Human immunodeficiency virus [HIV] disease: Secondary | ICD-10-CM

## 2013-03-30 NOTE — Progress Notes (Signed)
Subjective:    Patient ID: [name] is a 42 y.o. male.   ROS  Physical Exam  Constitutional: He is oriented to person, place, and time. He appears well-developed and well-nourished.  HENT:  Head: Normocephalic.  Neck: Normal range of motion.  Cardiovascular: Normal rate, regular rhythm and normal heart sounds.   Pulmonary/Chest: Effort normal and breath sounds normal.  Abdominal: Soft. Bowel sounds are normal.  Musculoskeletal: Normal range of motion.  Neurological: He is alert and oriented to person, place, and time.  Skin: Skin is warm and dry. No rash noted.  Psychiatric: He has a normal mood and affect. His behavior is normal. Judgment and thought content normal.     Pt here today for entry into A5321 [facility]) study. Fasting labs were drawn with no problems. Vital signs are stable. Pt received $50.00 gift card for visit. Next appointment scheduled for Monday, July 02, 2013 @ 9:30am. Tacey Heap RN

## 2013-03-31 LAB — BASIC METABOLIC PANEL
Calcium: 8.8 mg/dL (ref 8.4–10.5)
Glucose, Bld: 102 mg/dL — ABNORMAL HIGH (ref 70–99)
Sodium: 139 mEq/L (ref 135–145)

## 2013-04-03 ENCOUNTER — Other Ambulatory Visit: Payer: Self-pay | Admitting: *Deleted

## 2013-04-03 LAB — ALT: ALT: 41 U/L (ref 0–53)

## 2013-04-25 ENCOUNTER — Encounter: Payer: Self-pay | Admitting: Infectious Disease

## 2013-04-25 LAB — CD4/CD8 (T-HELPER/T-SUPPRESSOR CELL)
CD4: 811
CD8: 830

## 2013-05-04 ENCOUNTER — Other Ambulatory Visit: Payer: Self-pay | Admitting: Licensed Clinical Social Worker

## 2013-05-04 DIAGNOSIS — B20 Human immunodeficiency virus [HIV] disease: Secondary | ICD-10-CM

## 2013-05-04 MED ORDER — EMTRICITABINE-TENOFOVIR DF 200-300 MG PO TABS: 1.0000 | ORAL_TABLET | Freq: Every day | ORAL | Status: DC

## 2013-05-04 MED ORDER — EFAVIRENZ 600 MG PO TABS: 600.0000 mg | ORAL_TABLET | Freq: Every day | ORAL | Status: DC

## 2013-05-11 ENCOUNTER — Ambulatory Visit
Admission: RE | Admit: 2013-05-11 | Discharge: 2013-05-11 | Disposition: A | Discharge status: Home / Self Care | Payer: No Typology Code available for payment source | Source: Ambulatory Visit | Attending: Specialist | Admitting: Specialist

## 2013-05-11 ENCOUNTER — Other Ambulatory Visit: Payer: Self-pay | Admitting: Specialist

## 2013-05-11 ENCOUNTER — Encounter: Payer: Self-pay | Admitting: *Deleted

## 2013-05-11 DIAGNOSIS — Z21 Asymptomatic human immunodeficiency virus [HIV] infection status: Secondary | ICD-10-CM

## 2013-05-11 NOTE — Progress Notes (Signed)
[name] called and wanted his immunization record faxed to his immigration medical MD. I faxed his immuno record to [phone].

## 2013-05-15 ENCOUNTER — Encounter: Payer: Self-pay | Admitting: Infectious Disease

## 2013-06-07 ENCOUNTER — Other Ambulatory Visit: Payer: Self-pay

## 2013-06-21 ENCOUNTER — Ambulatory Visit (INDEPENDENT_AMBULATORY_CARE_PROVIDER_SITE_OTHER): Payer: Self-pay | Admitting: Infectious Disease

## 2013-06-21 ENCOUNTER — Encounter: Payer: Self-pay | Admitting: Infectious Disease

## 2013-06-21 VITALS — BP 143/94 | HR 93 | Temp 98.9°F | Ht 68.0 in | Wt 206.0 lb

## 2013-06-21 DIAGNOSIS — B20 Human immunodeficiency virus [HIV] disease: Secondary | ICD-10-CM

## 2013-06-21 DIAGNOSIS — H938X9 Other specified disorders of ear, unspecified ear: Secondary | ICD-10-CM

## 2013-06-21 DIAGNOSIS — Z23 Encounter for immunization: Secondary | ICD-10-CM

## 2013-06-21 DIAGNOSIS — R21 Rash and other nonspecific skin eruption: Secondary | ICD-10-CM

## 2013-06-21 DIAGNOSIS — H938X3 Other specified disorders of ear, bilateral: Secondary | ICD-10-CM

## 2013-06-21 MED ORDER — EFAVIRENZ-EMTRICITAB-TENOFOVIR 600-200-300 MG PO TABS: 1.0000 | ORAL_TABLET | Freq: Every day | ORAL | Status: DC

## 2013-06-21 MED ORDER — TRIAMCINOLONE ACETONIDE 0.5 % EX OINT: TOPICAL_OINTMENT | Freq: Two times a day (BID) | CUTANEOUS | Status: DC

## 2013-06-21 NOTE — Patient Instructions (Addendum)
Purchase OTC pseudophedrine and take 30mg  every 6 hours to relieve your congestion  If your ears still hurt in another 4 days call us back and we can call in an antibiotics

## 2013-06-21 NOTE — Progress Notes (Signed)
  Subjective:    Patient ID: [name], male    DOB: Aug 18, 1971, 42 y.o.   MRN: [id number]  HPI  42 year old African man previously perfectly controlled on Sustiva and Truvada for some reason not to formulated in 2 Atripla as yet. We reviewed various single tablet regimens and the patient was comfortable moving to cope formulation of his current regimen with Atripla. He is also had some problems with fullness in his ear is noted with left more than his right but no fevers or chills and has not tried any antihistamines or decongestants. He has had a scaling rash on his feet and ankles bilaterally that is erythematous and pruritic.  Review of Systems  Constitutional: Negative for fever, chills, diaphoresis, activity change, appetite change, fatigue and unexpected weight change.  HENT: Negative for congestion, sore throat, rhinorrhea, sneezing, trouble swallowing and sinus pressure.   Eyes: Negative for photophobia and visual disturbance.  Respiratory: Negative for cough, chest tightness, shortness of breath, wheezing and stridor.   Cardiovascular: Negative for chest pain, palpitations and leg swelling.  Gastrointestinal: Negative for nausea, vomiting, abdominal pain, diarrhea, constipation, blood in stool, abdominal distention and anal bleeding.  Genitourinary: Negative for dysuria, hematuria, flank pain and difficulty urinating.  Musculoskeletal: Negative for myalgias, back pain, joint swelling, arthralgias and gait problem.  Skin: Positive for rash. Negative for color change, pallor and wound.  Neurological: Negative for dizziness, tremors, weakness and light-headedness.  Hematological: Negative for adenopathy. Does not bruise/bleed easily.  Psychiatric/Behavioral: Negative for behavioral problems, confusion, sleep disturbance, dysphoric mood, decreased concentration and agitation.       Objective:   Physical Exam  Constitutional: He is oriented to person, place, and time. He appears  well-developed and well-nourished. No distress.  HENT:  Head: Normocephalic and atraumatic.  Mouth/Throat: Oropharynx is clear and moist. No oropharyngeal exudate.  Eyes: Conjunctivae and EOM are normal. Pupils are equal, round, and reactive to light. No scleral icterus.  Neck: Normal range of motion. Neck supple. No JVD present.  Cardiovascular: Normal rate, regular rhythm and normal heart sounds.  Exam reveals no gallop and no friction rub.   No murmur heard. Pulmonary/Chest: Effort normal and breath sounds normal. No respiratory distress. He has no wheezes. He has no rales. He exhibits no tenderness.  Abdominal: He exhibits no distension and no mass. There is no tenderness. There is no rebound and no guarding.  Musculoskeletal: He exhibits no edema and no tenderness.  Lymphadenopathy:    He has no cervical adenopathy.  Neurological: He is alert and oriented to person, place, and time. He has normal reflexes. He exhibits normal muscle tone. Coordination normal.  Skin: Skin is warm. Rash noted. He is not diaphoretic. No erythema. No pallor.     Psychiatric: He has a normal mood and affect. His behavior is normal. Judgment and thought content normal.          Assessment & Plan:   HIV: Placed on to formulated Atripla recheck labs in 4 months.  Hypertension: Likely a degree of white coat hypertension, check micrograms creatinine ratio.  Ear fullness: Take over-the-counter decongestants, and if it fails to resolve I will give her prescription for Augmentin.  Rash on his feet will give him triamcinolone cream

## 2013-06-25 ENCOUNTER — Ambulatory Visit: Payer: Self-pay

## 2013-07-02 ENCOUNTER — Ambulatory Visit (INDEPENDENT_AMBULATORY_CARE_PROVIDER_SITE_OTHER): Payer: Self-pay | Admitting: *Deleted

## 2013-07-02 VITALS — BP 138/92 | HR 72 | Temp 98.0°F | Ht 67.5 in | Wt 209.5 lb

## 2013-07-02 DIAGNOSIS — B20 Human immunodeficiency virus [HIV] disease: Secondary | ICD-10-CM

## 2013-07-02 DIAGNOSIS — Z23 Encounter for immunization: Secondary | ICD-10-CM

## 2013-07-02 NOTE — Progress Notes (Signed)
Patient here for week 24 A5322 study visit. He had VL and CD4 drawn on study A5321 in June already. He is c/o itchy rash on his feet. He had used some triamcinolone cream Dr. Daiva Eves gave him at the last MD visit, but he said he did not help. Dr. Daiva Eves wants him to try clotrimazole cream (OTC)  and see if that helps.  If not, he may need to come back to see him for a skin biopsy. We restarted a series of Hep B vaccine, because his surface antibody test was nonreactive in February. He is returning for A5321 visit in November and we will give him his 2nd booster at that visit.

## 2013-08-24 ENCOUNTER — Other Ambulatory Visit: Payer: Self-pay | Admitting: *Deleted

## 2013-08-24 DIAGNOSIS — Z21 Asymptomatic human immunodeficiency virus [HIV] infection status: Secondary | ICD-10-CM

## 2013-09-04 ENCOUNTER — Other Ambulatory Visit: Payer: Self-pay

## 2013-09-04 ENCOUNTER — Ambulatory Visit (INDEPENDENT_AMBULATORY_CARE_PROVIDER_SITE_OTHER): Payer: Self-pay | Admitting: *Deleted

## 2013-09-04 VITALS — BP 130/84 | HR 78 | Temp 97.9°F | Resp 16 | Wt 215.0 lb

## 2013-09-04 DIAGNOSIS — B20 Human immunodeficiency virus [HIV] disease: Secondary | ICD-10-CM

## 2013-09-04 DIAGNOSIS — Z23 Encounter for immunization: Secondary | ICD-10-CM

## 2013-09-04 DIAGNOSIS — Z21 Asymptomatic human immunodeficiency virus [HIV] infection status: Secondary | ICD-10-CM

## 2013-09-04 LAB — LIPID PANEL
Cholesterol: 251 mg/dL — ABNORMAL HIGH (ref 0–200)
Total CHOL/HDL Ratio: 4.6 Ratio
Triglycerides: 368 mg/dL — ABNORMAL HIGH (ref <150)
VLDL: 74 mg/dL — ABNORMAL HIGH (ref 0–40)

## 2013-09-04 LAB — CBC WITH DIFFERENTIAL/PLATELET
Basophils Absolute: 0 10*3/uL (ref 0.0–0.1)
Basophils Relative: 0 % (ref 0–1)
Eosinophils Absolute: 0.2 10*3/uL (ref 0.0–0.7)
Eosinophils Relative: 4 % (ref 0–5)
HCT: 40.6 % (ref 39.0–52.0)
Hemoglobin: 14.1 g/dL (ref 13.0–17.0)
Lymphocytes Relative: 52 % — ABNORMAL HIGH (ref 12–46)
Lymphs Abs: 2.6 10*3/uL (ref 0.7–4.0)
MCH: 34.1 pg — ABNORMAL HIGH (ref 26.0–34.0)
MCHC: 34.7 g/dL (ref 30.0–36.0)
MCV: 98.1 fL (ref 78.0–100.0)
Monocytes Absolute: 0.4 10*3/uL (ref 0.1–1.0)
Monocytes Relative: 9 % (ref 3–12)
Neutro Abs: 1.8 10*3/uL (ref 1.7–7.7)
Neutrophils Relative %: 35 % — ABNORMAL LOW (ref 43–77)
Platelets: 254 10*3/uL (ref 150–400)
RBC: 4.14 MIL/uL — ABNORMAL LOW (ref 4.22–5.81)
RDW: 13.2 % (ref 11.5–15.5)
WBC: 5 10*3/uL (ref 4.0–10.5)

## 2013-09-04 LAB — RPR

## 2013-09-04 NOTE — Addendum Note (Signed)
Addended by: Phill Myron on: 09/04/2013 04:44 PM   Modules accepted: Orders

## 2013-09-04 NOTE — Progress Notes (Signed)
Patient here for week 24 A5321 study visit. He denies any new problems or concerns. He did switch his medications to Atripla 2 months ago. He signed the new consent form for A5321 and we discussed the changes to the protocol. At this time he did not sign the spinal tap procedure form and is not willing to participate in that part of the study. I did give him his 2nd Hep B booster.

## 2013-09-05 LAB — COMPREHENSIVE METABOLIC PANEL
ALT: 73 U/L — ABNORMAL HIGH (ref 0–53)
Alkaline Phosphatase: 151 U/L — ABNORMAL HIGH (ref 39–117)
BUN: 14 mg/dL (ref 6–23)
Glucose, Bld: 107 mg/dL — ABNORMAL HIGH (ref 70–99)
Total Bilirubin: 0.4 mg/dL (ref 0.3–1.2)

## 2013-09-05 LAB — T-HELPER CELL (CD4) - (RCID CLINIC ONLY)
CD4 % Helper T Cell: 30 % — ABNORMAL LOW (ref 33–55)
CD4 T Cell Abs: 800 /uL (ref 400–2700)

## 2013-10-05 ENCOUNTER — Encounter: Payer: Self-pay | Admitting: Infectious Disease

## 2013-10-05 LAB — HIV-1 RNA QUANT-NO REFLEX-BLD: HIV-1 RNA Viral Load: <40

## 2013-10-23 ENCOUNTER — Other Ambulatory Visit (INDEPENDENT_AMBULATORY_CARE_PROVIDER_SITE_OTHER): Payer: Self-pay

## 2013-10-23 DIAGNOSIS — Z21 Asymptomatic human immunodeficiency virus [HIV] infection status: Secondary | ICD-10-CM

## 2013-10-23 DIAGNOSIS — B20 Human immunodeficiency virus [HIV] disease: Secondary | ICD-10-CM

## 2013-10-23 LAB — CBC WITH DIFFERENTIAL/PLATELET
Basophils Absolute: 0 10*3/uL (ref 0.0–0.1)
Basophils Relative: 0 % (ref 0–1)
EOS ABS: 0.2 10*3/uL (ref 0.0–0.7)
EOS PCT: 4 % (ref 0–5)
HCT: 41.8 % (ref 39.0–52.0)
HEMOGLOBIN: 14.1 g/dL (ref 13.0–17.0)
LYMPHS ABS: 2.9 10*3/uL (ref 0.7–4.0)
Lymphocytes Relative: 55 % — ABNORMAL HIGH (ref 12–46)
MCH: 33.5 pg (ref 26.0–34.0)
MCHC: 33.7 g/dL (ref 30.0–36.0)
MCV: 99.3 fL (ref 78.0–100.0)
MONO ABS: 0.6 10*3/uL (ref 0.1–1.0)
MONOS PCT: 11 % (ref 3–12)
NEUTROS PCT: 30 % — AB (ref 43–77)
Neutro Abs: 1.6 10*3/uL — ABNORMAL LOW (ref 1.7–7.7)
Platelets: 231 10*3/uL (ref 150–400)
RBC: 4.21 MIL/uL — ABNORMAL LOW (ref 4.22–5.81)
RDW: 13 % (ref 11.5–15.5)
WBC: 5.4 10*3/uL (ref 4.0–10.5)

## 2013-10-24 LAB — COMPLETE METABOLIC PANEL WITH GFR
ALBUMIN: 4.2 g/dL (ref 3.5–5.2)
ALT: 48 U/L (ref 0–53)
AST: 46 U/L — ABNORMAL HIGH (ref 0–37)
Alkaline Phosphatase: 169 U/L — ABNORMAL HIGH (ref 39–117)
BUN: 17 mg/dL (ref 6–23)
CO2: 25 meq/L (ref 19–32)
Calcium: 8.7 mg/dL (ref 8.4–10.5)
Chloride: 104 mEq/L (ref 96–112)
Creat: 0.8 mg/dL (ref 0.50–1.35)
GFR, Est African American: >89 mL/min
Glucose, Bld: 92 mg/dL (ref 70–99)
Potassium: 4 mEq/L (ref 3.5–5.3)
SODIUM: 140 meq/L (ref 135–145)
TOTAL PROTEIN: 6.9 g/dL (ref 6.0–8.3)
Total Bilirubin: 0.3 mg/dL (ref 0.3–1.2)

## 2013-10-24 LAB — HIV-1 RNA QUANT-NO REFLEX-BLD
HIV 1 RNA Quant: <20 copies/mL (ref <20)
HIV-1 RNA Quant, Log: <1.3 {Log} (ref <1.30)

## 2013-10-25 LAB — T-HELPER CELL (CD4) - (RCID CLINIC ONLY)
CD4 % Helper T Cell: 26 % — ABNORMAL LOW (ref 33–55)
CD4 T CELL ABS: 780 /uL (ref 400–2700)

## 2013-10-31 ENCOUNTER — Encounter: Payer: Self-pay | Admitting: Infectious Disease

## 2013-11-06 ENCOUNTER — Ambulatory Visit (INDEPENDENT_AMBULATORY_CARE_PROVIDER_SITE_OTHER): Payer: Self-pay | Admitting: *Deleted

## 2013-11-06 ENCOUNTER — Ambulatory Visit (INDEPENDENT_AMBULATORY_CARE_PROVIDER_SITE_OTHER): Payer: Self-pay | Admitting: Infectious Disease

## 2013-11-06 ENCOUNTER — Encounter: Payer: Self-pay | Admitting: Infectious Disease

## 2013-11-06 VITALS — BP 132/90 | HR 77 | Temp 97.4°F | Resp 16 | Wt 216.5 lb

## 2013-11-06 VITALS — BP 160/92 | HR 99 | Temp 98.4°F | Wt 213.0 lb

## 2013-11-06 DIAGNOSIS — I10 Essential (primary) hypertension: Secondary | ICD-10-CM

## 2013-11-06 DIAGNOSIS — Z21 Asymptomatic human immunodeficiency virus [HIV] infection status: Secondary | ICD-10-CM

## 2013-11-06 DIAGNOSIS — R21 Rash and other nonspecific skin eruption: Secondary | ICD-10-CM

## 2013-11-06 DIAGNOSIS — B20 Human immunodeficiency virus [HIV] disease: Secondary | ICD-10-CM

## 2013-11-06 DIAGNOSIS — R252 Cramp and spasm: Secondary | ICD-10-CM

## 2013-11-06 LAB — CBC WITH DIFFERENTIAL/PLATELET
Basophils Absolute: 0 10*3/uL (ref 0.0–0.1)
Basophils Relative: 0 % (ref 0–1)
Eosinophils Absolute: 0.2 10*3/uL (ref 0.0–0.7)
Eosinophils Relative: 5 % (ref 0–5)
HCT: 40 % (ref 39.0–52.0)
HEMOGLOBIN: 13.7 g/dL (ref 13.0–17.0)
LYMPHS PCT: 52 % — AB (ref 12–46)
Lymphs Abs: 2.6 10*3/uL (ref 0.7–4.0)
MCH: 33.7 pg (ref 26.0–34.0)
MCHC: 34.3 g/dL (ref 30.0–36.0)
MCV: 98.5 fL (ref 78.0–100.0)
MONOS PCT: 12 % (ref 3–12)
Monocytes Absolute: 0.6 10*3/uL (ref 0.1–1.0)
NEUTROS ABS: 1.6 10*3/uL — AB (ref 1.7–7.7)
Neutrophils Relative %: 31 % — ABNORMAL LOW (ref 43–77)
PLATELETS: 251 10*3/uL (ref 150–400)
RBC: 4.06 MIL/uL — AB (ref 4.22–5.81)
RDW: 12.8 % (ref 11.5–15.5)
WBC: 5 10*3/uL (ref 4.0–10.5)

## 2013-11-06 LAB — COMPREHENSIVE METABOLIC PANEL
ALT: 46 U/L (ref 0–53)
AST: 40 U/L — ABNORMAL HIGH (ref 0–37)
Albumin: 3.9 g/dL (ref 3.5–5.2)
Alkaline Phosphatase: 149 U/L — ABNORMAL HIGH (ref 39–117)
BUN: 15 mg/dL (ref 6–23)
CALCIUM: 8.7 mg/dL (ref 8.4–10.5)
CHLORIDE: 107 meq/L (ref 96–112)
CO2: 22 meq/L (ref 19–32)
CREATININE: 0.84 mg/dL (ref 0.50–1.35)
Glucose, Bld: 96 mg/dL (ref 70–99)
Potassium: 3.8 mEq/L (ref 3.5–5.3)
Sodium: 138 mEq/L (ref 135–145)
Total Bilirubin: 0.5 mg/dL (ref 0.3–1.2)
Total Protein: 6.7 g/dL (ref 6.0–8.3)

## 2013-11-06 LAB — LIPID PANEL
CHOL/HDL RATIO: 4 ratio
CHOLESTEROL: 222 mg/dL — AB (ref 0–200)
HDL: 55 mg/dL (ref >39)
LDL Cholesterol: 124 mg/dL — ABNORMAL HIGH (ref 0–99)
Triglycerides: 216 mg/dL — ABNORMAL HIGH (ref <150)
VLDL: 43 mg/dL — ABNORMAL HIGH (ref 0–40)

## 2013-11-06 LAB — HEMOGLOBIN A1C
Hgb A1c MFr Bld: 5.6 % (ref <5.7)
Mean Plasma Glucose: 114 mg/dL (ref <117)

## 2013-11-06 MED ORDER — FLUCONAZOLE 100 MG PO TABS: 100.0000 mg | ORAL_TABLET | Freq: Every day | ORAL | Status: DC

## 2013-11-06 NOTE — Progress Notes (Signed)
Subjective:    Patient ID: [name], male    DOB: Dec 10, 1970, 43 y.o.   MRN: [id number]  HPI   43 year old African man previously perfectly controlled on Sustiva and Truvada now on   Atripla.   When I last saw him he had a  a scaling rash on his feet and ankles bilaterally that is erythematous and pruritic.  This has persisted despite various topical creams including TRIAMCinolone, but he does not know if he has tried an antifungal   He also c/o new problem of his hands cramping at night when he sleeps on one side, right side with head __> right hand cramping, left side, same.  He has negative Tinel's sign.     Review of Systems  Constitutional: Negative for fever, chills, diaphoresis, activity change, appetite change, fatigue and unexpected weight change.  HENT: Negative for congestion, rhinorrhea, sinus pressure, sneezing, sore throat and trouble swallowing.   Eyes: Negative for photophobia and visual disturbance.  Respiratory: Negative for cough, chest tightness, shortness of breath, wheezing and stridor.   Cardiovascular: Negative for chest pain, palpitations and leg swelling.  Gastrointestinal: Negative for nausea, vomiting, abdominal pain, diarrhea, constipation, blood in stool, abdominal distention and anal bleeding.  Genitourinary: Negative for dysuria, hematuria, flank pain and difficulty urinating.  Musculoskeletal: Positive for arthralgias. Negative for back pain, gait problem, joint swelling and myalgias.  Skin: Positive for rash. Negative for color change, pallor and wound.  Neurological: Negative for dizziness, tremors, weakness and light-headedness.  Hematological: Negative for adenopathy. Does not bruise/bleed easily.  Psychiatric/Behavioral: Negative for behavioral problems, confusion, sleep disturbance, dysphoric mood, decreased concentration and agitation.       Objective:   Physical Exam  Constitutional: He is oriented to person, place, and time. He  appears well-developed and well-nourished. No distress.  HENT:  Head: Normocephalic and atraumatic.  Mouth/Throat: Oropharynx is clear and moist. No oropharyngeal exudate.  Eyes: Conjunctivae and EOM are normal. Pupils are equal, round, and reactive to light. No scleral icterus.  Neck: Normal range of motion. Neck supple. No JVD present.  Cardiovascular: Normal rate, regular rhythm and normal heart sounds.  Exam reveals no gallop and no friction rub.   No murmur heard. Pulmonary/Chest: Effort normal and breath sounds normal. No respiratory distress. He has no wheezes. He has no rales. He exhibits no tenderness.  Abdominal: He exhibits no distension and no mass. There is no tenderness. There is no rebound and no guarding.  Musculoskeletal: He exhibits no edema and no tenderness.  Lymphadenopathy:    He has no cervical adenopathy.  Neurological: He is alert and oriented to person, place, and time. He exhibits normal muscle tone. Coordination normal.  Skin: Skin is warm. Rash noted. He is not diaphoretic. No erythema. No pallor.     Psychiatric: He has a normal mood and affect. His behavior is normal. Judgment and thought content normal.          Assessment & Plan:   HIV: continue formulated Atripla , he initially suggested that his hand cramping problem might be related to ATripla which I doubt but I did thinking changing drug to STRIBILD might be good option  I spent greater than 25 minutes with the patient including greater than 50% of time in face to face counsel of the patient and in coordination of their care.  Hand cramping: sounds mechanical suggested he try to sleep on back or use something to protect his hand while he sleeps  Hypertension: Likely a degree of  white coat hypertension, is better on todays read  Rash on his feet will give  Fluconazole, and topical lotrimin, then lamisil. He is to RTC in one month and will consider biopsy at that time  SMoker: I spent > 3  minutes counsellign him on smoking cessation

## 2013-11-06 NOTE — Progress Notes (Signed)
[name] is here for A5322 HAILO study, week 48. He c/o tingling sensation in feet and hands bilat and feels it began when he started taking Atripla. He wants to discuss this during his appointment with Dr. Daiva EvesVan Dam this afternoon. Fasting labs were drawn and vital signs taken. Questionnaires were completed and he declined to partake in nuero testing. He received $50 gift card for visit. His next appt is January 23, 2014 @ 9:30 and he will receive his Hep B #3 vaccine at this visit. Tacey HeapElisha Epperson RN

## 2013-11-06 NOTE — Patient Instructions (Addendum)
I would like you to try:   #1 Lotrimin cream  On skin while taking the fluconazole  If not improving  Try Lamisil cream

## 2013-11-07 LAB — PROTEIN, URINE, RANDOM: Total Protein, Urine: 7 mg/dL

## 2013-11-07 LAB — CREATININE, URINE, RANDOM: CREATININE, URINE: 178.9 mg/dL

## 2013-12-06 ENCOUNTER — Ambulatory Visit (INDEPENDENT_AMBULATORY_CARE_PROVIDER_SITE_OTHER): Payer: Self-pay | Admitting: Infectious Disease

## 2013-12-06 ENCOUNTER — Encounter: Payer: Self-pay | Admitting: Infectious Disease

## 2013-12-06 VITALS — BP 138/85 | HR 96 | Temp 98.0°F | Wt 219.0 lb

## 2013-12-06 DIAGNOSIS — I10 Essential (primary) hypertension: Secondary | ICD-10-CM

## 2013-12-06 DIAGNOSIS — B20 Human immunodeficiency virus [HIV] disease: Secondary | ICD-10-CM

## 2013-12-06 DIAGNOSIS — L309 Dermatitis, unspecified: Secondary | ICD-10-CM

## 2013-12-06 DIAGNOSIS — L259 Unspecified contact dermatitis, unspecified cause: Secondary | ICD-10-CM

## 2013-12-06 MED ORDER — TRIAMCINOLONE ACETONIDE 0.5 % EX OINT: TOPICAL_OINTMENT | Freq: Two times a day (BID) | CUTANEOUS | Status: DC

## 2013-12-06 MED ORDER — ELVITEG-COBIC-EMTRICIT-TENOFDF 150-150-200-300 MG PO TABS: 1.0000 | ORAL_TABLET | Freq: Every day | ORAL | Status: DC

## 2013-12-06 NOTE — Progress Notes (Signed)
Subjective:    Patient ID: [name], male    DOB: 1971-02-17, 43 y.o.   MRN: [id number]  HPI   43 year old African man previously perfectly controlled on Sustiva and Truvada now on   Atripla.   He continues to suffer from rash on his feet and this has not responded well to topical antifungals and oral fluconazole and it sounds now on closer questioning he seems to have a steroid responsive condition.  He has NOT yet renewed ADAP because he typically does this with finishing his taxes but I urged him to do this asap. 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 He is concerned that his Atripla will make his UDS turn positive for THC. I offered to write letter for him or change to different Single Tablet Regimen and we decided to go with STRIBILD>    Review of Systems  Constitutional: Negative for fever, chills, diaphoresis, activity change, appetite change, fatigue and unexpected weight change.  HENT: Negative for congestion, rhinorrhea, sinus pressure, sneezing, sore throat and trouble swallowing.   Eyes: Negative for photophobia and visual disturbance.  Respiratory: Negative for cough, chest tightness, shortness of breath, wheezing and stridor.   Cardiovascular: Negative for chest pain, palpitations and leg swelling.  Gastrointestinal: Negative for nausea, vomiting, abdominal pain, diarrhea, constipation, blood in stool, abdominal distention and anal bleeding.  Genitourinary: Negative for dysuria, hematuria, flank pain and difficulty urinating.  Musculoskeletal: Positive for arthralgias. Negative for back pain, gait problem, joint swelling and myalgias.  Skin: Positive for rash. Negative for color change, pallor and wound.  Neurological: Negative for dizziness, tremors, weakness and light-headedness.  Hematological: Negative for adenopathy. Does not bruise/bleed easily.  Psychiatric/Behavioral: Negative for behavioral problems, confusion, sleep disturbance,  dysphoric mood, decreased concentration and agitation.       Objective:   Physical Exam  Constitutional: He is oriented to person, place, and time. He appears well-developed and well-nourished. No distress.  HENT:  Head: Normocephalic and atraumatic.  Mouth/Throat: Oropharynx is clear and moist. No oropharyngeal exudate.  Eyes: Conjunctivae and EOM are normal. Pupils are equal, round, and reactive to light. No scleral icterus.  Neck: Normal range of motion. Neck supple. No JVD present.  Cardiovascular: Normal rate, regular rhythm and normal heart sounds.  Exam reveals no gallop and no friction rub.   No murmur heard. Pulmonary/Chest: Effort normal and breath sounds normal. No respiratory distress. He has no wheezes. He has no rales. He exhibits no tenderness.  Abdominal: He exhibits no distension and no mass. There is no tenderness. There is no rebound and no guarding.  Musculoskeletal: He exhibits no edema and no tenderness.  Lymphadenopathy:    He has no cervical adenopathy.  Neurological: He is alert and oriented to person, place, and time. He exhibits normal muscle tone. Coordination normal.  Skin: Skin is warm. Rash noted. He is not diaphoretic. No erythema. No pallor.     Psychiatric: He has a normal mood and affect. His behavior is normal. Judgment and thought content normal.   Hyperpigmented lesions on ankles and legs:              Assessment & Plan:   HIV: change to STRIBILD, renew ADAP, Rtc in one month for labs  I spent greater than 25 minutes with the patient including greater than 50% of time in face to face counsel of the patient and in coordination of their care.  Hypertension: Likely a degree of white coat hypertension, is  better on todays read  Rash on his feet will give  Give triamcinolone

## 2013-12-20 ENCOUNTER — Ambulatory Visit: Payer: Self-pay

## 2013-12-20 ENCOUNTER — Encounter: Payer: Self-pay | Admitting: *Deleted

## 2013-12-24 ENCOUNTER — Other Ambulatory Visit: Payer: Self-pay | Admitting: *Deleted

## 2013-12-24 DIAGNOSIS — B20 Human immunodeficiency virus [HIV] disease: Secondary | ICD-10-CM

## 2013-12-24 MED ORDER — ELVITEG-COBIC-EMTRICIT-TENOFDF 150-150-200-300 MG PO TABS: 1.0000 | ORAL_TABLET | Freq: Every day | ORAL | Status: DC

## 2013-12-24 NOTE — Telephone Encounter (Signed)
ADAP application 

## 2014-01-21 ENCOUNTER — Ambulatory Visit (INDEPENDENT_AMBULATORY_CARE_PROVIDER_SITE_OTHER): Payer: Self-pay | Admitting: *Deleted

## 2014-01-21 DIAGNOSIS — B20 Human immunodeficiency virus [HIV] disease: Secondary | ICD-10-CM

## 2014-01-21 DIAGNOSIS — Z21 Asymptomatic human immunodeficiency virus [HIV] infection status: Secondary | ICD-10-CM

## 2014-01-21 DIAGNOSIS — Z23 Encounter for immunization: Secondary | ICD-10-CM

## 2014-01-21 DIAGNOSIS — Z299 Encounter for prophylactic measures, unspecified: Secondary | ICD-10-CM

## 2014-01-21 LAB — COMPREHENSIVE METABOLIC PANEL
ALBUMIN: 4.3 g/dL (ref 3.5–5.2)
ALK PHOS: 144 U/L — AB (ref 39–117)
ALT: 44 U/L (ref 0–53)
AST: 31 U/L (ref 0–37)
BUN: 17 mg/dL (ref 6–23)
CO2: 18 mEq/L — ABNORMAL LOW (ref 19–32)
Calcium: 8.6 mg/dL (ref 8.4–10.5)
Chloride: 106 mEq/L (ref 96–112)
Creat: 0.7 mg/dL (ref 0.50–1.35)
GLUCOSE: 96 mg/dL (ref 70–99)
POTASSIUM: 3.6 meq/L (ref 3.5–5.3)
Sodium: 137 mEq/L (ref 135–145)
Total Bilirubin: 0.3 mg/dL (ref 0.2–1.2)
Total Protein: 7.5 g/dL (ref 6.0–8.3)

## 2014-01-21 LAB — HEPATITIS C ANTIBODY: HCV Ab: NEGATIVE

## 2014-01-21 NOTE — Progress Notes (Signed)
[name] is here for A5321 ACTG HIV [place]s [place] Study, week 48. He is adhering to his regimen and states since starting Atripla he has had dreams but does not bother him enough to want to switch medication. He has an abscess and a tooth (Left Upper #15) that will need to be extracted and he took a two week antibiotic course for this and is awaiting a phone call for this visit. He states he currently has no pain since taking antibiotics. Fasting labs were obtained and questionnaire completed. Hair sample was obtained. I administered Hep B #3 vaccine after obtaining blood. He received $50 gift card for study visit. Next appointment scheduled for Monday, April 15, 2014 @ 10am. Tacey HeapElisha Epperson RN

## 2014-02-06 ENCOUNTER — Encounter: Payer: Self-pay | Admitting: Infectious Disease

## 2014-02-06 LAB — CD4/CD8 (T-HELPER/T-SUPPRESSOR CELL)
CD4%: 29.7
CD4: 1010
CD8 % Suppressor T Cell: 40.1
CD8: 1363

## 2014-02-06 LAB — HIV-1 RNA QUANT-NO REFLEX-BLD

## 2014-02-25 ENCOUNTER — Encounter: Payer: Self-pay | Admitting: Infectious Disease

## 2014-04-15 ENCOUNTER — Ambulatory Visit (INDEPENDENT_AMBULATORY_CARE_PROVIDER_SITE_OTHER): Payer: Self-pay | Admitting: *Deleted

## 2014-04-15 VITALS — Wt 217.0 lb

## 2014-04-15 DIAGNOSIS — B20 Human immunodeficiency virus [HIV] disease: Secondary | ICD-10-CM

## 2014-04-15 DIAGNOSIS — Z006 Encounter for examination for normal comparison and control in clinical research program: Secondary | ICD-10-CM

## 2014-04-15 NOTE — Progress Notes (Signed)
[name] is here for A5322, week 72. We reviewed updated version of the informed consent together. I fully explained the consent, risks, benefits, responsibilities, and answered questions. Participant verbalized understanding and signed the consent witnessed by me. I gave a copy of the signed consent to participant. He has started new ARV regimen of Stribild beginning in March of this year. He has no complaints. Only new symptom is increased appetite however he has only gained 0.5 lbs since January 2015. Fasting labs were drawn. He received $50 gift card for visit. Next appointment scheduled for August 19, 2014 @ 10am. Tacey HeapElisha Epperson RN

## 2014-04-22 ENCOUNTER — Other Ambulatory Visit: Payer: Self-pay

## 2014-05-06 ENCOUNTER — Ambulatory Visit (INDEPENDENT_AMBULATORY_CARE_PROVIDER_SITE_OTHER): Payer: Self-pay | Admitting: Infectious Disease

## 2014-05-06 ENCOUNTER — Encounter: Payer: Self-pay | Admitting: Infectious Disease

## 2014-05-06 VITALS — BP 151/99 | HR 90 | Temp 98.5°F | Ht 67.0 in | Wt 213.0 lb

## 2014-05-06 DIAGNOSIS — B369 Superficial mycosis, unspecified: Secondary | ICD-10-CM

## 2014-05-06 DIAGNOSIS — L259 Unspecified contact dermatitis, unspecified cause: Secondary | ICD-10-CM

## 2014-05-06 DIAGNOSIS — B20 Human immunodeficiency virus [HIV] disease: Secondary | ICD-10-CM

## 2014-05-06 DIAGNOSIS — L309 Dermatitis, unspecified: Secondary | ICD-10-CM

## 2014-05-06 NOTE — Patient Instructions (Signed)
Stop the Triamcinolone cream  Try TOPICAL LAMISIL FROM THE DRUG STORE AND APPLY TO YOUR SKIN TWICE DAILY  WE NEED YOU TO RENEW YOUR ADAP APPLICATION  RTC FOR LABS AND APPT IN 3 MONTHS

## 2014-05-06 NOTE — Progress Notes (Signed)
  Subjective:    Patient ID: [name], male    DOB: 11/29/1970, 43 y.o.   MRN: [id number]  HPI   43 year old African man previously perfectly controlled on Sustiva and Truvada --> Atripla. --> STRIBILD with VL <40 when checked via our ACTG trials unit.    He continues to suffer from rash on his feet and this has not responded well to topical antifungals he claimed in February but now not responding to steroids either, worse in summer better in winter    Review of Systems  Constitutional: Negative for fever, chills, diaphoresis, activity change, appetite change, fatigue and unexpected weight change.  HENT: Negative for congestion, rhinorrhea, sinus pressure, sneezing, sore throat and trouble swallowing.   Eyes: Negative for photophobia and visual disturbance.  Respiratory: Negative for cough, chest tightness, shortness of breath, wheezing and stridor.   Cardiovascular: Negative for chest pain, palpitations and leg swelling.  Gastrointestinal: Negative for nausea, vomiting, abdominal pain, diarrhea, constipation, blood in stool, abdominal distention and anal bleeding.  Genitourinary: Negative for dysuria, hematuria, flank pain and difficulty urinating.  Musculoskeletal: Positive for arthralgias. Negative for back pain, gait problem, joint swelling and myalgias.  Skin: Positive for rash. Negative for color change, pallor and wound.  Neurological: Negative for dizziness, tremors, weakness and light-headedness.  Hematological: Negative for adenopathy. Does not bruise/bleed easily.  Psychiatric/Behavioral: Negative for behavioral problems, confusion, sleep disturbance, dysphoric mood, decreased concentration and agitation.       Objective:   Physical Exam  Constitutional: He is oriented to person, place, and time. He appears well-developed and well-nourished. No distress.  HENT:  Head: Normocephalic and atraumatic.  Mouth/Throat: Oropharynx is clear and moist. No oropharyngeal  exudate.  Eyes: Conjunctivae and EOM are normal. Pupils are equal, round, and reactive to light. No scleral icterus.  Neck: Normal range of motion. Neck supple. No JVD present.  Cardiovascular: Normal rate, regular rhythm and normal heart sounds.  Exam reveals no gallop and no friction rub.   No murmur heard. Pulmonary/Chest: Effort normal and breath sounds normal. No respiratory distress. He has no wheezes. He has no rales. He exhibits no tenderness.  Abdominal: He exhibits no distension and no mass. There is no tenderness. There is no rebound and no guarding.  Musculoskeletal: He exhibits no edema and no tenderness.  Lymphadenopathy:    He has no cervical adenopathy.  Neurological: He is alert and oriented to person, place, and time. He exhibits normal muscle tone. Coordination normal.  Skin: Skin is warm. Rash noted. He is not diaphoretic. No erythema. No pallor.     Psychiatric: He has a normal mood and affect. His behavior is normal. Judgment and thought content normal.   Hyperpigmented lesions on ankles and legs:  Last visit:       This visit:               Assessment & Plan:   HIV: continue STRIBILD, renew ADAP,  Check labs in 3 months   Rash on his feet will give  DC triamcinolone, try otc lamisil, would like to try systemic lamisil and consider biopsy

## 2014-05-08 ENCOUNTER — Ambulatory Visit: Payer: Self-pay

## 2014-05-27 ENCOUNTER — Other Ambulatory Visit: Payer: Self-pay | Admitting: *Deleted

## 2014-05-27 DIAGNOSIS — B20 Human immunodeficiency virus [HIV] disease: Secondary | ICD-10-CM

## 2014-05-27 MED ORDER — ELVITEG-COBIC-EMTRICIT-TENOFDF 150-150-200-300 MG PO TABS: 1.0000 | ORAL_TABLET | Freq: Every day | ORAL | Status: DC

## 2014-07-01 ENCOUNTER — Other Ambulatory Visit: Payer: Self-pay | Admitting: *Deleted

## 2014-07-01 MED ORDER — ACYCLOVIR 400 MG PO TABS: 400.0000 mg | ORAL_TABLET | ORAL | Status: DC

## 2014-07-01 NOTE — Progress Notes (Signed)
He should come in and see clinic MD if he needs to discuss meds further. Sustiva and Atripla actually cause gynecomastia so would not want to switch him back tot hat if he is truly having problems with the STRIBILD that involve nipple problems. Could consider COMPLERA if not on PPI, antacid and can take with full meal. TRIUMEQ still not on formulary for START correct?

## 2014-07-01 NOTE — Progress Notes (Signed)
Patient called and c/o lips tingling like he is getting fever blisters and wanted the acyclovir called in. He said he had run out of the prescription. He also said that he has noticed some side effects from stribild that he feels are troubling. He says he has gained a lot of weight, especially in his hip/buttocks like a women and his nipples have become very tender. He wants Dr. Daiva Eves to call him about the medication. I told him I would let the physician know and that he may need to come in to be seen.

## 2014-07-03 ENCOUNTER — Other Ambulatory Visit: Payer: Self-pay | Admitting: *Deleted

## 2014-07-03 MED ORDER — ACYCLOVIR 400 MG PO TABS
400.0000 mg | ORAL_TABLET | ORAL | Status: DC
Start: 2014-07-03 — End: 2015-12-31

## 2014-07-18 ENCOUNTER — Telehealth: Payer: Self-pay | Admitting: *Deleted

## 2014-07-18 NOTE — Telephone Encounter (Signed)
[name] called and is concerned about his breasts enlarging and says he is getting fat "like a woman". He thinks that the Stribild is causing it and wants to change his med back to Atripla. He wants to go ahead and stop the stribild. He has an appt with Dr. Daiva EvesVan Dam on 10/30 but doesn't think he can wait that long. I told him I would let Dr. Daiva EvesVan Dam know about it.

## 2014-07-22 ENCOUNTER — Telehealth: Payer: Self-pay | Admitting: *Deleted

## 2014-07-22 DIAGNOSIS — Z21 Asymptomatic human immunodeficiency virus [HIV] infection status: Secondary | ICD-10-CM

## 2014-07-22 MED ORDER — ABACAVIR-DOLUTEGRAVIR-LAMIVUD 600-50-300 MG PO TABS: 1.0000 | ORAL_TABLET | Freq: Every day | ORAL | Status: DC

## 2014-07-22 NOTE — Telephone Encounter (Signed)
Discussed [name]'s complaints about breast and hip enlargement with use of Stribild with Dr. Tommy Medal. He insists that Stribild has made his body contour's change. He has been on it since February and wants to go back to Atripla. Dr. Tommy Medal does not want him to go on Atripla with would like him to switch to Triumeq. He had a negative HLA-B 5701. I called and Tenny and talked to him about it. He is willing to switch his therapy to Triumeq. Prescription will be sent in to [facility].

## 2014-07-22 NOTE — Telephone Encounter (Signed)
Would change to TRIUMEQ since EFV causes gynecomastia. His breast enlargement is likely from prior regimen

## 2014-07-22 NOTE — Telephone Encounter (Signed)
Thanks Kim.

## 2014-08-01 ENCOUNTER — Ambulatory Visit (INDEPENDENT_AMBULATORY_CARE_PROVIDER_SITE_OTHER): Payer: Self-pay | Admitting: *Deleted

## 2014-08-01 VITALS — BP 147/96 | HR 71 | Temp 97.8°F | Resp 16 | Wt 221.2 lb

## 2014-08-01 DIAGNOSIS — B20 Human immunodeficiency virus [HIV] disease: Secondary | ICD-10-CM

## 2014-08-01 DIAGNOSIS — Z21 Asymptomatic human immunodeficiency virus [HIV] infection status: Secondary | ICD-10-CM

## 2014-08-01 DIAGNOSIS — Z006 Encounter for examination for normal comparison and control in clinical research program: Secondary | ICD-10-CM

## 2014-08-01 LAB — HIV-1 RNA QUANT-NO REFLEX-BLD: HIV-1 RNA Viral Load: <40

## 2014-08-01 LAB — COMPREHENSIVE METABOLIC PANEL
ALT: 33 U/L (ref 0–53)
AST: 24 U/L (ref 0–37)
Albumin: 4.1 g/dL (ref 3.5–5.2)
Alkaline Phosphatase: 119 U/L — ABNORMAL HIGH (ref 39–117)
BILIRUBIN TOTAL: 0.5 mg/dL (ref 0.2–1.2)
BUN: 11 mg/dL (ref 6–23)
CO2: 22 meq/L (ref 19–32)
CREATININE: 0.81 mg/dL (ref 0.50–1.35)
Calcium: 9 mg/dL (ref 8.4–10.5)
Chloride: 106 mEq/L (ref 96–112)
Glucose, Bld: 91 mg/dL (ref 70–99)
Potassium: 4 mEq/L (ref 3.5–5.3)
Sodium: 140 mEq/L (ref 135–145)
Total Protein: 7.1 g/dL (ref 6.0–8.3)

## 2014-08-01 NOTE — Progress Notes (Addendum)
[name] is here for his week 96 A5321 study visit. A new IC was signed as required for study changes. He does not wish to partake in the Lumbar Puncture part of the study. He says he has not been feeling well the past month. He started having fatigue, SOB with exertion at first and then developed a congested cough. He has tried alka-seltzer plus for sx and says it has helped some. He is very concerned about enlarging breast, left noticeably larger than right and says he started noticing it around July. He believes that Stribild is causing it and has asked to switch. He started Triumeq 2 days ago. The left breast is very tender, on palpation there are no masses noted. He has also noticed some chills/sweats past month. Today his BP is elevated above his baseline, he denies any headache. I did tell him he may need to start some BP med if it continues. He finally got his immigration status approved as permanent and now wishes to go home and visit family. He plans to leave around November/December and will [facility] to get vaccinations that he will need. He will retrun in February for the next A5322 visit.  He will be seeing Dr. Daiva EvesVan Dam in 2 weeks, but I told him to call for an earlier appt. If he started feeling worse.

## 2014-08-02 LAB — T-HELPER CELL (CD4) - (RCID CLINIC ONLY)
CD4 % Helper T Cell: 32 % — ABNORMAL LOW (ref 33–55)
CD4 T Cell Abs: 910 /uL (ref 400–2700)

## 2014-08-05 ENCOUNTER — Other Ambulatory Visit: Payer: Self-pay

## 2014-08-16 ENCOUNTER — Other Ambulatory Visit: Payer: Self-pay | Admitting: Infectious Disease

## 2014-08-16 ENCOUNTER — Encounter: Payer: Self-pay | Admitting: Infectious Disease

## 2014-08-16 ENCOUNTER — Ambulatory Visit (INDEPENDENT_AMBULATORY_CARE_PROVIDER_SITE_OTHER): Payer: Self-pay | Admitting: Infectious Disease

## 2014-08-16 VITALS — BP 155/101 | HR 91 | Temp 98.2°F | Wt 222.0 lb

## 2014-08-16 DIAGNOSIS — N62 Hypertrophy of breast: Secondary | ICD-10-CM

## 2014-08-16 DIAGNOSIS — R0609 Other forms of dyspnea: Secondary | ICD-10-CM

## 2014-08-16 DIAGNOSIS — B20 Human immunodeficiency virus [HIV] disease: Secondary | ICD-10-CM

## 2014-08-16 DIAGNOSIS — I10 Essential (primary) hypertension: Secondary | ICD-10-CM

## 2014-08-16 NOTE — Progress Notes (Signed)
Subjective:    Patient ID: [name], male    DOB: 1971/01/10, 43 y.o.   MRN: [id number]  HPI  43 year old African man previously perfectly controlled on Sustiva and Truvada --> Atripla. --> STRIBILD with VL <40 when checked via our ACTG trials unit.  He then states that he then developed gynecomastia which he attributed to the [facility] though I suspect it was artery long standing and present and likely due to the SUSTIVA that he had been on for many years.  I changed him [facility] to [facility] because of his believe that his gynecomastia was coming [facility]. Since then he has had dyspnea on exertion that he is also blaming on antiretrovirals. He does continue to smoke this happens after walking several blocks.     Review of Systems  Constitutional: Negative for chills, diaphoresis, activity change, appetite change, fatigue and unexpected weight change.  HENT: Negative for congestion, sinus pressure, sneezing and trouble swallowing.   Eyes: Negative for photophobia and visual disturbance.  Respiratory: Negative for cough, chest tightness and stridor.   Cardiovascular: Negative for palpitations.  Gastrointestinal: Negative for nausea, diarrhea, constipation, blood in stool, abdominal distention and anal bleeding.  Genitourinary: Negative for dysuria, hematuria, flank pain and difficulty urinating.  Musculoskeletal: Positive for arthralgias. Negative for back pain, gait problem, joint swelling and myalgias.  Skin: Negative for color change, pallor and wound.  Neurological: Negative for dizziness, tremors, weakness and light-headedness.  Hematological: Negative for adenopathy. Does not bruise/bleed easily.  Psychiatric/Behavioral: Negative for behavioral problems, confusion, sleep disturbance, dysphoric mood, decreased concentration and agitation.       Objective:   Physical Exam  Constitutional: He is oriented to person, place, and time. He appears well-developed and well-nourished.  No distress.  HENT:  Head: Normocephalic and atraumatic.  Mouth/Throat: Oropharynx is clear and moist. No oropharyngeal exudate.  Eyes: Conjunctivae and EOM are normal. Pupils are equal, round, and reactive to light. No scleral icterus.  Neck: Normal range of motion. Neck supple. No JVD present.  Cardiovascular: Normal rate, regular rhythm and normal heart sounds.  Exam reveals no gallop and no friction rub.   No murmur heard. Pulmonary/Chest: Effort normal and breath sounds normal. No respiratory distress. He has no wheezes. He has no rales. He exhibits no tenderness. Left breast exhibits tenderness.    Abdominal: He exhibits no distension and no mass. There is no tenderness. There is no rebound and no guarding.  Musculoskeletal: He exhibits no edema and no tenderness.  Lymphadenopathy:    He has no cervical adenopathy.  Neurological: He is alert and oriented to person, place, and time. He exhibits normal muscle tone. Coordination normal.  Skin: Skin is warm. He is not diaphoretic. No erythema. No pallor.     Psychiatric: He has a normal mood and affect. His behavior is normal. Judgment and thought content normal.   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 Will check labs and if possible ultrasound though I don't think is unable to afford the ultrasound. I spent greater than 25 minutes with the patient including greater than 50% of time in face to face counsel of the patient and in coordination of their care.   Dyspnea on exertion: This could be related to his constant smoking a pack a day. Only to  be vigilant about his risks for coronary artery disease, and may need to reconsider the abacavir component of his regimen  Smoking: encouraged him to stop smoking and offered prescriptions to help with this but he stated he did  stop on his own. I spent greater than 3 minutes counseling him to stop.

## 2014-08-21 ENCOUNTER — Ambulatory Visit: Payer: Self-pay | Admitting: Infectious Disease

## 2014-08-21 LAB — HCG, QUANTITATIVE, PREGNANCY

## 2014-08-21 LAB — LUTEINIZING HORMONE: LH: 8.1 m[IU]/mL (ref 1.5–9.3)

## 2014-08-21 LAB — TESTOSTERONE: TESTOSTERONE: 194 ng/dL — AB (ref 300–890)

## 2014-08-21 LAB — ESTRADIOL: Estradiol: 21.7 pg/mL

## 2014-08-27 ENCOUNTER — Other Ambulatory Visit: Payer: Self-pay | Admitting: Infectious Disease

## 2014-08-27 DIAGNOSIS — N62 Hypertrophy of breast: Secondary | ICD-10-CM

## 2014-09-02 ENCOUNTER — Encounter: Payer: Self-pay | Admitting: Infectious Disease

## 2014-09-10 ENCOUNTER — Ambulatory Visit
Admission: RE | Admit: 2014-09-10 | Discharge: 2014-09-10 | Disposition: A | Discharge status: Home / Self Care | Payer: PRIVATE HEALTH INSURANCE | Source: Ambulatory Visit | Attending: Infectious Disease | Admitting: Infectious Disease

## 2014-09-10 DIAGNOSIS — N62 Hypertrophy of breast: Secondary | ICD-10-CM

## 2014-11-18 ENCOUNTER — Ambulatory Visit: Payer: Self-pay | Admitting: *Deleted

## 2014-12-12 ENCOUNTER — Ambulatory Visit (INDEPENDENT_AMBULATORY_CARE_PROVIDER_SITE_OTHER): Payer: Self-pay | Admitting: *Deleted

## 2014-12-12 VITALS — BP 135/86 | HR 79 | Temp 98.0°F | Resp 16 | Ht 67.75 in | Wt 211.5 lb

## 2014-12-12 DIAGNOSIS — Z006 Encounter for examination for normal comparison and control in clinical research program: Secondary | ICD-10-CM

## 2014-12-12 LAB — COMPREHENSIVE METABOLIC PANEL
ALK PHOS: 115 U/L (ref 39–117)
ALT: 46 U/L (ref 0–53)
AST: 28 U/L (ref 0–37)
Albumin: 4.1 g/dL (ref 3.5–5.2)
BILIRUBIN TOTAL: 0.5 mg/dL (ref 0.2–1.2)
BUN: 12 mg/dL (ref 6–23)
CALCIUM: 8.8 mg/dL (ref 8.4–10.5)
CO2: 24 mEq/L (ref 19–32)
Chloride: 103 mEq/L (ref 96–112)
Creat: 0.83 mg/dL (ref 0.50–1.35)
Glucose, Bld: 102 mg/dL — ABNORMAL HIGH (ref 70–99)
Potassium: 3.7 mEq/L (ref 3.5–5.3)
SODIUM: 139 meq/L (ref 135–145)
Total Protein: 7 g/dL (ref 6.0–8.3)

## 2014-12-12 LAB — LIPID PANEL
CHOL/HDL RATIO: 4.1 ratio
Cholesterol: 224 mg/dL — ABNORMAL HIGH (ref 0–200)
HDL: 54 mg/dL (ref >=40)
LDL CALC: 124 mg/dL — AB (ref 0–99)
Triglycerides: 229 mg/dL — ABNORMAL HIGH (ref <150)
VLDL: 46 mg/dL — ABNORMAL HIGH (ref 0–40)

## 2014-12-12 LAB — HEMOGLOBIN A1C
Hgb A1c MFr Bld: 5.8 % — ABNORMAL HIGH (ref <5.7)
MEAN PLASMA GLUCOSE: 120 mg/dL — AB (ref <117)

## 2014-12-12 NOTE — Progress Notes (Signed)
[name] is here for his week 96 A5322 study visit. He says he feels fine and denies any problems, but is still concerned about his weight and enlarged breasts. He had gone back to [place] for 2 [place]2 months recently. He said he was treated with some lotion over there that cleared up the rashes on his legs and feet. He thought it was for some kind of tropical disease. He still has some tingling in his hands and feet occasionally. He will return in April for the next study visit and to see Dr. Daiva EvesVan Dam.

## 2014-12-13 LAB — HEPATITIS B SURFACE ANTIBODY,QUALITATIVE: Hep B S Ab: POSITIVE — AB

## 2014-12-13 LAB — HEPATITIS B SURFACE ANTIGEN: HEP B S AG: NEGATIVE

## 2014-12-13 LAB — PROTEIN, URINE, RANDOM: Total Protein, Urine: 14 mg/dL (ref 5–25)

## 2014-12-13 LAB — HEPATITIS C ANTIBODY: HCV Ab: NEGATIVE

## 2014-12-13 LAB — CREATININE, URINE, RANDOM: Creatinine, Urine: 193.9 mg/dL

## 2014-12-30 ENCOUNTER — Ambulatory Visit: Payer: Self-pay

## 2014-12-31 ENCOUNTER — Other Ambulatory Visit: Payer: Self-pay | Admitting: *Deleted

## 2014-12-31 DIAGNOSIS — Z21 Asymptomatic human immunodeficiency virus [HIV] infection status: Secondary | ICD-10-CM

## 2014-12-31 MED ORDER — ABACAVIR-DOLUTEGRAVIR-LAMIVUD 600-50-300 MG PO TABS: 1.0000 | ORAL_TABLET | Freq: Every day | ORAL | Status: DC

## 2014-12-31 NOTE — Telephone Encounter (Signed)
ADAP Application 

## 2015-01-07 ENCOUNTER — Encounter: Payer: Self-pay | Admitting: Infectious Disease

## 2015-01-07 LAB — CD4/CD8 (T-HELPER/T-SUPPRESSOR CELL)
CD4%: 24.8
CD4: 595
CD8 T CELL SUPPRESSOR: 38.5
CD8: 924

## 2015-01-07 LAB — HIV-1 RNA QUANT-NO REFLEX-BLD

## 2015-01-29 ENCOUNTER — Ambulatory Visit: Payer: Self-pay | Admitting: Infectious Disease

## 2015-02-05 ENCOUNTER — Ambulatory Visit: Payer: Self-pay | Admitting: *Deleted

## 2015-02-05 VITALS — BP 140/99 | HR 75 | Temp 98.0°F | Resp 16 | Wt 218.2 lb

## 2015-02-05 DIAGNOSIS — Z006 Encounter for examination for normal comparison and control in clinical research program: Secondary | ICD-10-CM

## 2015-02-05 LAB — COMPREHENSIVE METABOLIC PANEL
ALK PHOS: 92 U/L (ref 39–117)
ALT: 32 U/L (ref 0–53)
AST: 25 U/L (ref 0–37)
Albumin: 4 g/dL (ref 3.5–5.2)
BILIRUBIN TOTAL: 0.5 mg/dL (ref 0.2–1.2)
BUN: 12 mg/dL (ref 6–23)
CO2: 22 mEq/L (ref 19–32)
Calcium: 8.9 mg/dL (ref 8.4–10.5)
Chloride: 106 mEq/L (ref 96–112)
Creat: 0.84 mg/dL (ref 0.50–1.35)
Glucose, Bld: 89 mg/dL (ref 70–99)
Potassium: 4 mEq/L (ref 3.5–5.3)
SODIUM: 140 meq/L (ref 135–145)
Total Protein: 6.9 g/dL (ref 6.0–8.3)

## 2015-02-05 NOTE — Progress Notes (Signed)
[name] is here for his week 96 A5321 study visit. He continues to c/o fatigue, breast swelling in both breasts, gaining weight. His testosterone level from October was low and I discussed this with him. I told him he would probably benefit from testosterone replacement, but I was unsure if ADAP would cover any of those medications. His BP was also elevated in both arms and I told him he probably needed medication for that. I scheduled him an appt with Dr. Daiva EvesVan Dam in a few weeks to discuss this. I am unsure of how much he actually understood of what I was telling him due to language and cultural barriers. He says he doesn't want to "look like a woman" and is mostly concerned about weight gain and breast enlargement. He will return for study in July.

## 2015-02-20 ENCOUNTER — Encounter: Payer: Self-pay | Admitting: Infectious Disease

## 2015-02-20 LAB — HIV-1 RNA QUANT-NO REFLEX-BLD: HIV-1 RNA Viral Load: <40

## 2015-02-24 ENCOUNTER — Encounter: Payer: Self-pay | Admitting: Infectious Disease

## 2015-02-24 ENCOUNTER — Ambulatory Visit (INDEPENDENT_AMBULATORY_CARE_PROVIDER_SITE_OTHER): Payer: Self-pay | Admitting: Infectious Disease

## 2015-02-24 VITALS — BP 147/88 | HR 88 | Temp 98.1°F | Wt 213.0 lb

## 2015-02-24 DIAGNOSIS — E291 Testicular hypofunction: Secondary | ICD-10-CM

## 2015-02-24 DIAGNOSIS — R7989 Other specified abnormal findings of blood chemistry: Secondary | ICD-10-CM

## 2015-02-24 DIAGNOSIS — N62 Hypertrophy of breast: Secondary | ICD-10-CM

## 2015-02-24 DIAGNOSIS — B20 Human immunodeficiency virus [HIV] disease: Secondary | ICD-10-CM

## 2015-02-24 DIAGNOSIS — I10 Essential (primary) hypertension: Secondary | ICD-10-CM

## 2015-02-24 HISTORY — DX: Human immunodeficiency virus (HIV) disease: B20

## 2015-02-24 HISTORY — DX: Other specified abnormal findings of blood chemistry: R79.89

## 2015-02-24 MED ORDER — HYDROCHLOROTHIAZIDE 25 MG PO TABS: 25.0000 mg | ORAL_TABLET | Freq: Every day | ORAL | Status: DC

## 2015-02-24 NOTE — Progress Notes (Signed)
Subjective:    Patient ID: [name], male    DOB: 28-Dec-1970, 44 y.o.   MRN: [id number]  HPI   44 year-old [place] previously perfectly controlled on Sustiva and Truvada --> Atripla. --> STRIBILD with VL <40 when checked via our ACTG trials unit.  He then states that he then developed gynecomastia which he attributed to [facility] though I suspect it was  long standing and present and likely due to the SUSTIVA that he had been on for many years.  I changed him [facility] to [facility] because of his believe that his [facility]astia was [facility]. \  He is still with worsening problems with gynecomastia L >>R.   He is also worried about BP though it is better.      Review of Systems  Constitutional: Negative for chills, diaphoresis, activity change, appetite change, fatigue and unexpected weight change.  HENT: Negative for congestion, sinus pressure, sneezing and trouble swallowing.   Eyes: Negative for photophobia and visual disturbance.  Respiratory: Negative for cough, chest tightness and stridor.   Cardiovascular: Negative for palpitations.  Gastrointestinal: Negative for nausea, diarrhea, constipation, blood in stool, abdominal distention and anal bleeding.  Genitourinary: Negative for dysuria, hematuria, flank pain and difficulty urinating.  Musculoskeletal: Positive for arthralgias. Negative for myalgias, back pain, joint swelling and gait problem.  Skin: Negative for color change, pallor and wound.  Neurological: Negative for dizziness, tremors, weakness and light-headedness.  Hematological: Negative for adenopathy. Does not bruise/bleed easily.  Psychiatric/Behavioral: Negative for behavioral problems, confusion, sleep disturbance, dysphoric mood, decreased concentration and agitation.       Objective:   Physical Exam  Constitutional: He is oriented to person, place, and time. He appears well-developed and well-nourished. No distress.  HENT:  Head:  Normocephalic and atraumatic.  Mouth/Throat: Oropharynx is clear and moist. No oropharyngeal exudate.  Eyes: Conjunctivae and EOM are normal. No scleral icterus.  Neck: Normal range of motion. Neck supple. No JVD present.  Cardiovascular: Normal rate, regular rhythm and normal heart sounds.  Exam reveals no gallop and no friction rub.   No murmur heard. Pulmonary/Chest: Effort normal and breath sounds normal. No respiratory distress. He has no wheezes. He has no rales. He exhibits no tenderness. Left breast exhibits tenderness.    Abdominal: He exhibits no distension and no mass. There is no tenderness. There is no rebound and no guarding.  Musculoskeletal: He exhibits no edema or tenderness.  Lymphadenopathy:    He has no cervical adenopathy.  Neurological: He is alert and oriented to person, place, and time. He exhibits normal muscle tone. Coordination normal.  Skin: Skin is warm. He is not diaphoretic. No erythema. No pallor.     Psychiatric: He has a normal mood and affect. His behavior is normal. Judgment and thought content normal.   lesions on ankles and legs:       Assessment & Plan:   HIV: continue TRIUMEQ, RTC in 2-3 months and check labs, renew ADAP  Gynecomastia: Looks on exam to be longstanding and is  likely due to EFV not STRIBILD. His testosterone was low when checked in afternoon this fall. I spent greater than 40 minutes with the patient including greater than 50% of time in face to face counsel of the patient re possible causes of gynecomastia and low testosterone levels and in coordination of their care.   We will check repeat testosterone with free testosterone, TSH, cortisol, ferritin, PSA in EARLY am  If he has consistent low testosterone  and now contraindications to replacing this would start him on testosterone but may need assistance in getting these meds.  Smoking: encouraged him to stop smoking   HTN: will start on HCTZ and recheck BP in a week after  starting

## 2015-03-06 ENCOUNTER — Ambulatory Visit: Payer: Self-pay | Admitting: *Deleted

## 2015-03-06 ENCOUNTER — Other Ambulatory Visit: Payer: Self-pay

## 2015-03-06 VITALS — BP 143/98 | HR 71

## 2015-03-06 DIAGNOSIS — Z0130 Encounter for examination of blood pressure without abnormal findings: Secondary | ICD-10-CM

## 2015-03-06 DIAGNOSIS — R7989 Other specified abnormal findings of blood chemistry: Secondary | ICD-10-CM

## 2015-03-06 NOTE — Progress Notes (Signed)
Patient came today for labs and for blood pressure check (labs first).  When asked, he states he picked up the medication, has had no trouble taking it.  BP still elevated (143/98).  Patient states he hasn't taken it yet today, took his last dose around 3pm yesterday.  He states that he needs to take it with food or else he feels "woozy" but says he does take it daily.  RN educated the patient on his blood pressure result, offered another appointment to check it next week.  Pt declined.  RN advised him to keep track of his pressure at home or in the community ([facility]  Patient agreed. Andree CossHowell, Katee Wentland M, RN

## 2015-03-07 LAB — TESTOSTERONE, FREE, TOTAL, SHBG
SEX HORMONE BINDING: 47 nmol/L (ref 10–50)
Testosterone, Free: 39.5 pg/mL — ABNORMAL LOW (ref 47.0–244.0)
Testosterone-% Free: 1.5 % — ABNORMAL LOW (ref 1.6–2.9)
Testosterone: 256 ng/dL — ABNORMAL LOW (ref 300–890)

## 2015-03-07 LAB — CORTISOL: CORTISOL PLASMA: 12.7 ug/dL

## 2015-03-07 LAB — PSA: PSA: 0.13 ng/mL (ref <=4.00)

## 2015-03-07 LAB — PROLACTIN: PROLACTIN: 8.7 ng/mL (ref 2.1–17.1)

## 2015-03-07 LAB — FERRITIN: Ferritin: 34 ng/mL (ref 22–322)

## 2015-03-07 LAB — TSH: TSH: 1.518 u[IU]/mL (ref 0.350–4.500)

## 2015-03-07 LAB — FOLLICLE STIMULATING HORMONE: FSH: 19.3 m[IU]/mL — ABNORMAL HIGH (ref 1.4–18.1)

## 2015-03-07 LAB — LUTEINIZING HORMONE: LH: 12 m[IU]/mL — ABNORMAL HIGH (ref 1.5–9.3)

## 2015-03-10 ENCOUNTER — Telehealth: Payer: Self-pay | Admitting: Infectious Disease

## 2015-03-10 MED ORDER — TESTOSTERONE 50 MG/5GM (1%) TD GEL: 50.0000 g | Freq: Every day | TRANSDERMAL | Status: DC

## 2015-03-10 NOTE — Telephone Encounter (Signed)
Pt is indeed still deficient in testosterone. Minh or Marcelino DusterMichelle can you double check dose. He will likely have to come and see Pam to get pt assistance for this?

## 2015-03-12 ENCOUNTER — Other Ambulatory Visit: Payer: Self-pay | Admitting: Pharmacist Clinician (PhC)/ Clinical Pharmacy Specialist

## 2015-03-12 MED ORDER — TESTOSTERONE 50 MG/5GM (1%) TD GEL: 5.0000 g | Freq: Every day | TRANSDERMAL | Status: DC

## 2015-03-12 NOTE — Telephone Encounter (Signed)
Fixed the rx. He should be able to get since it's on the ADAP formulary.

## 2015-03-12 NOTE — Telephone Encounter (Signed)
THANKS buDDY!

## 2015-04-25 ENCOUNTER — Encounter (INDEPENDENT_AMBULATORY_CARE_PROVIDER_SITE_OTHER): Payer: Self-pay | Admitting: *Deleted

## 2015-04-25 VITALS — BP 124/85 | HR 85 | Temp 98.1°F | Resp 16 | Wt 217.5 lb

## 2015-04-25 DIAGNOSIS — Z006 Encounter for examination for normal comparison and control in clinical research program: Secondary | ICD-10-CM

## 2015-04-25 DIAGNOSIS — R7989 Other specified abnormal findings of blood chemistry: Secondary | ICD-10-CM

## 2015-04-25 NOTE — Progress Notes (Signed)
[name] is here for his week 120 A5322 study visit. He denies any new problems except for an itchy rash on his left foot. He has never started the androgel and denies having any fatigue. He says he feels a little better since starting the BP medication. He has an appt with Dr. Daiva EvesVan Dam on Monday. He needs to return in August for A5321 study visit.

## 2015-04-28 ENCOUNTER — Ambulatory Visit (INDEPENDENT_AMBULATORY_CARE_PROVIDER_SITE_OTHER): Payer: Self-pay | Admitting: Infectious Disease

## 2015-04-28 ENCOUNTER — Encounter: Payer: Self-pay | Admitting: Infectious Disease

## 2015-04-28 VITALS — BP 134/88 | HR 94 | Temp 98.1°F | Wt 216.0 lb

## 2015-04-28 DIAGNOSIS — N62 Hypertrophy of breast: Secondary | ICD-10-CM

## 2015-04-28 DIAGNOSIS — B20 Human immunodeficiency virus [HIV] disease: Secondary | ICD-10-CM

## 2015-04-28 DIAGNOSIS — R7989 Other specified abnormal findings of blood chemistry: Secondary | ICD-10-CM

## 2015-04-28 DIAGNOSIS — R21 Rash and other nonspecific skin eruption: Secondary | ICD-10-CM

## 2015-04-28 DIAGNOSIS — I10 Essential (primary) hypertension: Secondary | ICD-10-CM

## 2015-04-28 DIAGNOSIS — E291 Testicular hypofunction: Secondary | ICD-10-CM

## 2015-04-28 MED ORDER — METHYLPREDNISOLONE SODIUM SUCC 125 MG IJ SOLR: 125.0000 mg | Freq: Once | INTRAMUSCULAR | Status: DC

## 2015-04-28 MED ORDER — SODIUM CHLORIDE 0.9 % IV SOLN: 125.0000 mg | Freq: Once | INTRAVENOUS | Status: DC

## 2015-04-28 MED ORDER — TESTOSTERONE 50 MG/5GM (1%) TD GEL: 5.0000 g | Freq: Every day | TRANSDERMAL | Status: DC

## 2015-04-28 MED ORDER — SODIUM CHLORIDE 0.9 % IV SOLN
125.0000 mg | Freq: Once | INTRAVENOUS | Status: AC
  Administered 2015-04-28: 130 mg via INTRAMUSCULAR

## 2015-04-28 NOTE — Progress Notes (Signed)
Subjective:    Patient ID: [name], male    DOB: 1970-11-04, 44 y.o.   MRN: [id number]  HPI [name] is a 44 y/o male with HIV and HTN. His last CD4 count was 595/ VL <40. He continues to take Triumeq daily. At his last visit, he was found to have low testosterone and discussed starting androgel at this visit. He also is c/o a rash on his right foot that he states becomes worse during the summer and lays underneath the strap of his sandle. He states that it is very itchy but has not other symptoms. He denies rashes in any other place. He has tried several OTC foot sprays as well as Triamcinolone cream and these have not helped. He is asking for a referral to see Dermatology about this.   Outpatient Encounter Prescriptions as of 04/28/2015  Medication Sig  . acyclovir (ZOVIRAX) 400 MG tablet Take 1 tablet (400 mg total) by mouth every 4 (four) hours while awake. Take 2 1/2 tabs as needed for tingling or signs of herpes labialis  . hydrochlorothiazide (HYDRODIURIL) 25 MG tablet Take 1 tablet (25 mg total) by mouth daily.  . Abacavir-Dolutegravir-Lamivud (TRIUMEQ) 600-50-300 MG TABS Take 1 tablet by mouth daily. (Patient not taking: Reported on 04/28/2015)  . testosterone (ANDROGEL) 50 MG/5GM (1%) GEL Place 5 g onto the skin daily. (Patient not taking: Reported on 04/28/2015)   No facility-administered encounter medications on file as of 04/28/2015.    Review of Systems  Constitutional: Negative for fever, chills, fatigue and unexpected weight change.  HENT: Negative for mouth sores and trouble swallowing.   Eyes: Negative for photophobia and visual disturbance.  Respiratory: Negative for cough, chest tightness and shortness of breath.   Cardiovascular: Negative for chest pain, palpitations and leg swelling.  Gastrointestinal: Negative for abdominal pain, diarrhea, constipation and abdominal distention.  Genitourinary: Negative for difficulty urinating.  Musculoskeletal: Negative for myalgias  and arthralgias.  Skin: Positive for rash. Negative for color change.  Neurological: Negative for syncope, weakness, numbness and headaches.  Hematological: Negative for adenopathy.  Psychiatric/Behavioral: Negative for behavioral problems and sleep disturbance. The patient is not nervous/anxious.        Objective:   Physical Exam  Constitutional: He is oriented to person, place, and time. He appears well-developed and well-nourished.  HENT:  Head: Normocephalic and atraumatic.  Eyes: Conjunctivae are normal. Pupils are equal, round, and reactive to light.  Neck: Normal range of motion. Neck supple.  Cardiovascular: Normal rate and regular rhythm.   Pulmonary/Chest: Effort normal and breath sounds normal.  Abdominal: Soft. Bowel sounds are normal.  Musculoskeletal: Normal range of motion.  Lymphadenopathy:    He has no cervical adenopathy.  Neurological: He is alert and oriented to person, place, and time.  Skin: Skin is warm and dry. Rash noted.  Hyperpigmented, dry rash to right medial side of foot.    Lab Results  Component Value Date   HIV1RNAQUANT <20 10/23/2013    Lab Results  Component Value Date   CD4TABS 595 12/12/2014   CD4TABS 910 08/01/2014   CD4TABS 1010 01/21/2014      Assessment & Plan:  HIV, well controlled.  - Continue current regimen - Enrolled in clinical trial  Low testosterone: - Given hand prescription for Androgel  Right foot rash: - Will give IM Slu-Medrol for right foot rash - Will refer to Dermatology per pt request.   INFECTIOUS DISEASE ATTENDING ADDENDUM:     [facility]  Date: 04/28/2015  Patient name: [name]  Medical record number: [id number]  Date of birth: Dec 23, 1970    This patient has been seen and discussed with the NP student Kathryne GinLauren O'Neil. Please see her note for complete details. I concur with their findings with the following additions/corrections  He is doing very well on TRIUMEQ  and is also adhering to his BP meds. I gave him handwritten rx for his testosterone today.  He perseverated about foot rash and wished for referral to Dermatology which we can make though I warned him he would have to pay out of pocket and he would have to wait for several months to be seen in my experience.   I did offer a punch biopsy, oral prednisone since he said an injection (likely solumedrol) had helped in the past and ultimatley offered him IM injection of solumedrol to see if this would temporize his complaints. He is currently wearing sandles in order to scratch his foot constantly.  See picture   04/28/15:    I spent greater than 25 minutes with the patient including greater than 50% of time in face to face counsel of the patient re his HIV, HTN, his low testosterone and his rash and in coordination of their care.   Acey LavCornelius Van Dam 04/28/2015, 8:57 PM

## 2015-04-28 NOTE — Patient Instructions (Signed)
Please consider letting us do a "punch biopsy"  Please start taking generic version of zyrtec every day  Consider whether you would like to take a trial of steroids that we can prescribe  We will in the meantime refer you to Dermatology

## 2015-05-16 ENCOUNTER — Telehealth: Payer: Self-pay | Admitting: *Deleted

## 2015-05-16 NOTE — Telephone Encounter (Signed)
Needing appt for RW/ADAP renewal

## 2015-06-03 ENCOUNTER — Ambulatory Visit: Payer: Self-pay

## 2015-06-09 ENCOUNTER — Encounter (INDEPENDENT_AMBULATORY_CARE_PROVIDER_SITE_OTHER): Payer: Self-pay | Admitting: *Deleted

## 2015-06-09 VITALS — BP 129/87 | HR 74 | Temp 98.1°F | Resp 17 | Wt 211.2 lb

## 2015-06-09 DIAGNOSIS — Z006 Encounter for examination for normal comparison and control in clinical research program: Secondary | ICD-10-CM

## 2015-06-09 LAB — COMPREHENSIVE METABOLIC PANEL
ALT: 22 U/L (ref 9–46)
AST: 21 U/L (ref 10–40)
Albumin: 4.1 g/dL (ref 3.6–5.1)
Alkaline Phosphatase: 97 U/L (ref 40–115)
BUN: 13 mg/dL (ref 7–25)
CALCIUM: 8.9 mg/dL (ref 8.6–10.3)
CO2: 23 mmol/L (ref 20–31)
Chloride: 101 mmol/L (ref 98–110)
Creat: 0.9 mg/dL (ref 0.60–1.35)
GLUCOSE: 93 mg/dL (ref 65–99)
POTASSIUM: 3.2 mmol/L — AB (ref 3.5–5.3)
Sodium: 141 mmol/L (ref 135–146)
Total Bilirubin: 0.5 mg/dL (ref 0.2–1.2)
Total Protein: 7.5 g/dL (ref 6.1–8.1)

## 2015-06-09 NOTE — Progress Notes (Signed)
[name] is here for A5321 study, week 120. We reviewed new LOA for study and answered questions he had. He verbalized understanding and signed consent. He continues to have itchy rash on feet bilat. He states it gets worse during the summer months and he has been dealing with this for a while. It follows the pattern of his sandals. ?? Allergy to sandals or the change of weather... He says he just deals with it until summer is over and it calms down. Fasting blood was drawn. No hair sample obtained due to insufficient amount. Questionnaire completed. He received $50 gift card for visit and next appointment is in January for a5322 study. Tacey Heap RN

## 2015-07-10 ENCOUNTER — Encounter: Payer: Self-pay | Admitting: Infectious Disease

## 2015-07-10 LAB — HIV-1 RNA QUANT-NO REFLEX-BLD

## 2015-07-28 ENCOUNTER — Other Ambulatory Visit: Payer: Self-pay | Admitting: Infectious Disease

## 2015-07-29 ENCOUNTER — Encounter: Payer: Self-pay | Admitting: Infectious Disease

## 2015-07-29 ENCOUNTER — Telehealth: Payer: Self-pay | Admitting: *Deleted

## 2015-07-29 ENCOUNTER — Ambulatory Visit (INDEPENDENT_AMBULATORY_CARE_PROVIDER_SITE_OTHER): Payer: Self-pay | Admitting: Infectious Disease

## 2015-07-29 VITALS — BP 141/92 | HR 89 | Temp 98.2°F | Ht 68.0 in | Wt 215.0 lb

## 2015-07-29 DIAGNOSIS — I10 Essential (primary) hypertension: Secondary | ICD-10-CM

## 2015-07-29 DIAGNOSIS — R7989 Other specified abnormal findings of blood chemistry: Secondary | ICD-10-CM

## 2015-07-29 DIAGNOSIS — N62 Hypertrophy of breast: Secondary | ICD-10-CM

## 2015-07-29 DIAGNOSIS — B20 Human immunodeficiency virus [HIV] disease: Secondary | ICD-10-CM

## 2015-07-29 DIAGNOSIS — M545 Low back pain, unspecified: Secondary | ICD-10-CM | POA: Insufficient documentation

## 2015-07-29 DIAGNOSIS — Z23 Encounter for immunization: Secondary | ICD-10-CM

## 2015-07-29 DIAGNOSIS — R509 Fever, unspecified: Secondary | ICD-10-CM

## 2015-07-29 DIAGNOSIS — E291 Testicular hypofunction: Secondary | ICD-10-CM

## 2015-07-29 DIAGNOSIS — B353 Tinea pedis: Secondary | ICD-10-CM

## 2015-07-29 DIAGNOSIS — M5440 Lumbago with sciatica, unspecified side: Secondary | ICD-10-CM

## 2015-07-29 DIAGNOSIS — R21 Rash and other nonspecific skin eruption: Secondary | ICD-10-CM

## 2015-07-29 HISTORY — DX: Tinea pedis: B35.3

## 2015-07-29 HISTORY — DX: Fever, unspecified: R50.9

## 2015-07-29 HISTORY — DX: Low back pain, unspecified: M54.50

## 2015-07-29 LAB — CBC WITH DIFFERENTIAL/PLATELET
BASOS ABS: 0.1 10*3/uL (ref 0.0–0.1)
BASOS PCT: 1 % (ref 0–1)
EOS PCT: 4 % (ref 0–5)
Eosinophils Absolute: 0.2 10*3/uL (ref 0.0–0.7)
HEMATOCRIT: 42.5 % (ref 39.0–52.0)
Hemoglobin: 14.7 g/dL (ref 13.0–17.0)
LYMPHS PCT: 55 % — AB (ref 12–46)
Lymphs Abs: 3.4 10*3/uL (ref 0.7–4.0)
MCH: 32.7 pg (ref 26.0–34.0)
MCHC: 34.6 g/dL (ref 30.0–36.0)
MCV: 94.4 fL (ref 78.0–100.0)
MPV: 9.8 fL (ref 8.6–12.4)
Monocytes Absolute: 0.5 10*3/uL (ref 0.1–1.0)
Monocytes Relative: 9 % (ref 3–12)
NEUTROS ABS: 1.9 10*3/uL (ref 1.7–7.7)
Neutrophils Relative %: 31 % — ABNORMAL LOW (ref 43–77)
Platelets: 247 10*3/uL (ref 150–400)
RBC: 4.5 MIL/uL (ref 4.22–5.81)
RDW: 12.7 % (ref 11.5–15.5)
WBC: 6.1 10*3/uL (ref 4.0–10.5)

## 2015-07-29 LAB — COMPLETE METABOLIC PANEL WITH GFR
ALT: 26 U/L (ref 9–46)
AST: 26 U/L (ref 10–40)
Albumin: 4 g/dL (ref 3.6–5.1)
Alkaline Phosphatase: 86 U/L (ref 40–115)
BUN: 11 mg/dL (ref 7–25)
CALCIUM: 8.8 mg/dL (ref 8.6–10.3)
CHLORIDE: 103 mmol/L (ref 98–110)
CO2: 24 mmol/L (ref 20–31)
CREATININE: 0.79 mg/dL (ref 0.60–1.35)
GFR, Est Non African American: >89 mL/min (ref >=60)
Glucose, Bld: 86 mg/dL (ref 65–99)
POTASSIUM: 3.6 mmol/L (ref 3.5–5.3)
Sodium: 137 mmol/L (ref 135–146)
Total Bilirubin: 0.7 mg/dL (ref 0.2–1.2)
Total Protein: 6.9 g/dL (ref 6.1–8.1)

## 2015-07-29 LAB — C-REACTIVE PROTEIN: CRP: 0.8 mg/dL — AB (ref <0.60)

## 2015-07-29 MED ORDER — CYCLOBENZAPRINE HCL 5 MG PO TABS: 5.0000 mg | ORAL_TABLET | Freq: Three times a day (TID) | ORAL | Status: DC | PRN

## 2015-07-29 MED ORDER — FLUCONAZOLE 100 MG PO TABS: 100.0000 mg | ORAL_TABLET | Freq: Every day | ORAL | Status: DC

## 2015-07-29 NOTE — Progress Notes (Signed)
Chief complaint: persistent problems with pruritic feet though better now with less heat And now new complaint of lower back pain and fevers   Subjective:    Patient ID: [name], male    DOB: November 30, 1970, 44 y.o.   MRN: [id number]  HPI   44 year-old [place] man previously perfectly controlled on Sustiva and Truvada --> Atripla. --> STRIBILD to [place].  Lab Results  Component Value Date   HIV1RNAQUANT <20 10/23/2013   Lab Results  Component Value Date   CD4TABS 595 12/12/2014   CD4TABS 910 08/01/2014   CD4TABS 1010 01/21/2014   He is on testosterone replacement.  He still suffers from pruritic feet despite various topical agents that we have used and no improvement sp IM injection of corticosteroids.  He is grieving loss of his friend who died recently due to a 'blood clot."  At the end of the visit he stated that he had "one more thing" and this was new onset of lower back pain and fevers with onset on Friday.   He told me he had been given anti-anxiety agent and muscle relaxant for this in the ED before and asked for these again.  Past Medical History  Diagnosis Date  . HIV infection ([place])   . Hypertension   . Depression   . Low testosterone 02/24/2015  . HIV disease ([place]) 02/24/2015  . Tinea pedis 07/29/2015    No past surgical history on file. No surgeries  No family history on file.  Mother with Htn   Social History   Social History  . Marital Status: Legally Separated    Spouse Name: N/A  . Number of Children: N/A  . Years of Education: N/A   Social History Main Topics  . Smoking status: Current Every Day Smoker -- 0.50 packs/day    Types: Cigarettes  . Smokeless tobacco: Never Used     Comment: pt. not ready to quit smoking at this time  . Alcohol Use: 1.8 oz/week    3 Standard drinks or equivalent per week     Comment: socially  . Drug Use: No  . Sexual Activity: Not Currently     Comment: delcined condoms   Other Topics Concern  . None    Social History Narrative    No Known Allergies   Current outpatient prescriptions:  .  Abacavir-Dolutegravir-Lamivud (TRIUMEQ) 600-50-300 MG TABS, Take 1 tablet by mouth daily., Disp: 30 tablet, Rfl: 5 .  acyclovir (ZOVIRAX) 400 MG tablet, Take 1 tablet (400 mg total) by mouth every 4 (four) hours while awake. Take 2 1/2 tabs as needed for tingling or signs of herpes labialis, Disp: 20 tablet, Rfl: 1 .  hydrochlorothiazide (HYDRODIURIL) 25 MG tablet, Take 1 tablet (25 mg total) by mouth daily., Disp: 30 tablet, Rfl: 11 .  cyclobenzaprine (FLEXERIL) 5 MG tablet, Take 1 tablet (5 mg total) by mouth 3 (three) times daily as needed for muscle spasms., Disp: 30 tablet, Rfl: 1 .  fluconazole (DIFLUCAN) 100 MG tablet, Take 1 tablet (100 mg total) by mouth daily., Disp: 30 tablet, Rfl: 1      Review of Systems  Constitutional: Positive for fever and fatigue. Negative for chills, diaphoresis, activity change, appetite change and unexpected weight change.  HENT: Negative for congestion, sinus pressure, sneezing and trouble swallowing.   Eyes: Negative for photophobia and visual disturbance.  Respiratory: Negative for cough, chest tightness and stridor.   Cardiovascular: Negative for palpitations.  Gastrointestinal: Negative for nausea, diarrhea, constipation, blood in stool,  abdominal distention and anal bleeding.  Genitourinary: Negative for dysuria, hematuria, flank pain and difficulty urinating.  Musculoskeletal: Positive for back pain and arthralgias. Negative for myalgias, joint swelling and gait problem.  Skin: Positive for rash. Negative for color change, pallor and wound.  Neurological: Negative for dizziness, tremors, weakness and light-headedness.  Hematological: Negative for adenopathy. Does not bruise/bleed easily.  Psychiatric/Behavioral: Positive for dysphoric mood. Negative for behavioral problems, confusion, sleep disturbance and agitation.       Objective:   Physical Exam   Constitutional: He is oriented to person, place, and time. He appears well-developed and well-nourished. No distress.  HENT:  Head: Normocephalic and atraumatic.  Mouth/Throat: Oropharynx is clear and moist. No oropharyngeal exudate.  Eyes: Conjunctivae and EOM are normal. No scleral icterus.  Neck: Normal range of motion. Neck supple. No JVD present.  Cardiovascular: Normal rate, regular rhythm and normal heart sounds.  Exam reveals no gallop and no friction rub.   No murmur heard. Pulmonary/Chest: Effort normal and breath sounds normal. No respiratory distress. He has no wheezes. He has no rales. He exhibits no tenderness. Left breast exhibits tenderness.    Abdominal: He exhibits no distension and no mass. There is no tenderness. There is no rebound and no guarding.  Musculoskeletal: He exhibits no edema or tenderness.       Back:  Lymphadenopathy:    He has no cervical adenopathy.  Neurological: He is alert and oriented to person, place, and time. He exhibits normal muscle tone. Coordination normal.  Skin: Skin is warm. He is not diaphoretic. No erythema. No pallor.     Psychiatric: He has a normal mood and affect. His behavior is normal. Judgment and thought content normal.   lesions on ankles and legs:       Assessment & Plan:   New complaint of fever, lower back pain:  Check ESR, CRP, blood cultures x 2 ,CMP, CBC w differential, labs  If not improving get MRI Lumbar spine with contrast  In interim he can have flexeril if used cautiously with NSAIDS   HIV: continue TRIUMEQ, renew ADAP  Low testosterone: continue therapy   HTN: conntinue HCTZ   Rash on feet: will try fluconazole, (cannot afford lamisil) will biopsy myself in future no insurance so he cannot see a Dermatologist himself  CV risk on REPRIEVE study  Grieving: offered to have him meet with Grayland Ormond but he refused. Continue supportive therapy  I spent greater than 40 minutes with the patient including  greater than 50% of time in face to face counsel of the patient re his lowe back pain, fever, grieving, his HIV, his HTN, his rah and in coordination of their care.

## 2015-07-29 NOTE — Telephone Encounter (Signed)
Patient stopped RN in the lobby while checking out. He wanted to know if he was supposed to continue on the testosterone therapy.  He used it for one month only. Please advise. Andree Coss, RN

## 2015-07-29 NOTE — Telephone Encounter (Signed)
He certainly SHOULD continue it!

## 2015-07-30 ENCOUNTER — Other Ambulatory Visit: Payer: Self-pay | Admitting: *Deleted

## 2015-07-30 LAB — RPR

## 2015-07-30 LAB — SEDIMENTATION RATE: SED RATE: 4 mm/h (ref 0–15)

## 2015-07-30 MED ORDER — TESTOSTERONE 50 MG/5GM (1%) TD GEL: 5.0000 g | Freq: Every day | TRANSDERMAL | Status: DC

## 2015-07-31 ENCOUNTER — Encounter: Payer: Self-pay | Admitting: Infectious Disease

## 2015-07-31 LAB — HIV-1 RNA QUANT-NO REFLEX-BLD

## 2015-07-31 LAB — T-HELPER CELL (CD4) - (RCID CLINIC ONLY)
CD4 % Helper T Cell: 29 % — ABNORMAL LOW (ref 33–55)
CD4 T Cell Abs: 990 /uL (ref 400–2700)

## 2015-08-04 LAB — CULTURE, BLOOD (SINGLE)
ORGANISM ID, BACTERIA: NO GROWTH
ORGANISM ID, BACTERIA: NO GROWTH

## 2015-08-26 ENCOUNTER — Other Ambulatory Visit: Payer: Self-pay | Admitting: Infectious Disease

## 2015-09-29 ENCOUNTER — Ambulatory Visit: Payer: Self-pay | Admitting: Infectious Disease

## 2015-10-30 ENCOUNTER — Other Ambulatory Visit: Payer: Self-pay | Admitting: Infectious Disease

## 2015-11-03 ENCOUNTER — Encounter (INDEPENDENT_AMBULATORY_CARE_PROVIDER_SITE_OTHER): Payer: Self-pay | Admitting: *Deleted

## 2015-11-03 VITALS — BP 138/82 | HR 87 | Temp 98.4°F | Resp 16 | Wt 215.8 lb

## 2015-11-03 DIAGNOSIS — Z006 Encounter for examination for normal comparison and control in clinical research program: Secondary | ICD-10-CM

## 2015-11-03 LAB — CBC WITH DIFFERENTIAL/PLATELET
BASOS PCT: 1 % (ref 0–1)
Basophils Absolute: 0 10*3/uL (ref 0.0–0.1)
EOS ABS: 0.1 10*3/uL (ref 0.0–0.7)
Eosinophils Relative: 3 % (ref 0–5)
HCT: 42.3 % (ref 39.0–52.0)
Hemoglobin: 14.5 g/dL (ref 13.0–17.0)
Lymphocytes Relative: 43 % (ref 12–46)
Lymphs Abs: 2.1 10*3/uL (ref 0.7–4.0)
MCH: 33 pg (ref 26.0–34.0)
MCHC: 34.3 g/dL (ref 30.0–36.0)
MCV: 96.1 fL (ref 78.0–100.0)
MONOS PCT: 11 % (ref 3–12)
MPV: 9.8 fL (ref 8.6–12.4)
Monocytes Absolute: 0.5 10*3/uL (ref 0.1–1.0)
NEUTROS PCT: 42 % — AB (ref 43–77)
Neutro Abs: 2.1 10*3/uL (ref 1.7–7.7)
PLATELETS: 192 10*3/uL (ref 150–400)
RBC: 4.4 MIL/uL (ref 4.22–5.81)
RDW: 13.3 % (ref 11.5–15.5)
WBC: 4.9 10*3/uL (ref 4.0–10.5)

## 2015-11-03 LAB — LIPID PANEL
Cholesterol: 181 mg/dL (ref 125–200)
HDL: 53 mg/dL (ref >=40)
LDL CALC: 105 mg/dL (ref <130)
Total CHOL/HDL Ratio: 3.4 Ratio (ref <=5.0)
Triglycerides: 114 mg/dL (ref <150)
VLDL: 23 mg/dL (ref <30)

## 2015-11-03 LAB — HEMOGLOBIN A1C
HEMOGLOBIN A1C: 6 % — AB (ref <5.7)
Mean Plasma Glucose: 126 mg/dL — ABNORMAL HIGH (ref <117)

## 2015-11-03 NOTE — Progress Notes (Signed)
[name] is here for his week 144 visit for The HAILO Study: A Long Term follow-up of Older HIV-Infected Adults in the ACTG, an observational study addressing the issues of aging, HIV infection and Inflammation. He says he has been "feeling bad" since Friday with cold sx. Of sinus pressure, nasal drainage, productive cough, headache, nausea, chilss and sweats. He has been taking OTC meds for relief. I discussed this with Dr. Daiva EvesVan Dam. I did tell him if he didn't start feeling better within a week to call us to be seen. He will return in March for A5321.

## 2015-11-04 LAB — CREATININE, URINE, RANDOM: CREATININE, URINE: 275 mg/dL (ref 20–370)

## 2015-11-04 LAB — COMPREHENSIVE METABOLIC PANEL
ALK PHOS: 105 U/L (ref 40–115)
ALT: 28 U/L (ref 9–46)
AST: 24 U/L (ref 10–40)
Albumin: 3.7 g/dL (ref 3.6–5.1)
BUN: 9 mg/dL (ref 7–25)
CHLORIDE: 103 mmol/L (ref 98–110)
CO2: 28 mmol/L (ref 20–31)
Calcium: 8.7 mg/dL (ref 8.6–10.3)
Creat: 0.86 mg/dL (ref 0.60–1.35)
GLUCOSE: 97 mg/dL (ref 65–99)
POTASSIUM: 3.7 mmol/L (ref 3.5–5.3)
Sodium: 139 mmol/L (ref 135–146)
Total Bilirubin: 0.3 mg/dL (ref 0.2–1.2)
Total Protein: 6.7 g/dL (ref 6.1–8.1)

## 2015-11-04 LAB — PROTEIN, URINE, RANDOM: TOTAL PROTEIN, URINE: 26 mg/dL — AB (ref 5–25)

## 2015-11-10 ENCOUNTER — Ambulatory Visit: Payer: Self-pay | Admitting: Infectious Disease

## 2015-11-10 ENCOUNTER — Telehealth: Payer: Self-pay | Admitting: *Deleted

## 2015-11-10 NOTE — Telephone Encounter (Signed)
Called patient to inform of missed visit. Patient last seen in research 11/03/15. Left message. Andree Coss, RN

## 2015-11-20 ENCOUNTER — Encounter: Payer: Self-pay | Admitting: Infectious Disease

## 2015-11-20 LAB — HIV-1 RNA QUANT-NO REFLEX-BLD

## 2015-11-25 ENCOUNTER — Other Ambulatory Visit: Payer: Self-pay | Admitting: Infectious Disease

## 2015-11-27 ENCOUNTER — Encounter: Payer: Self-pay | Admitting: Infectious Disease

## 2015-12-31 ENCOUNTER — Encounter (INDEPENDENT_AMBULATORY_CARE_PROVIDER_SITE_OTHER): Payer: Self-pay | Admitting: *Deleted

## 2015-12-31 VITALS — BP 126/82 | HR 69 | Temp 98.0°F | Resp 16 | Wt 208.5 lb

## 2015-12-31 DIAGNOSIS — Z006 Encounter for examination for normal comparison and control in clinical research program: Secondary | ICD-10-CM

## 2015-12-31 LAB — COMPREHENSIVE METABOLIC PANEL
ALK PHOS: 80 U/L (ref 40–115)
ALT: 22 U/L (ref 9–46)
AST: 23 U/L (ref 10–40)
Albumin: 4.1 g/dL (ref 3.6–5.1)
BILIRUBIN TOTAL: 0.4 mg/dL (ref 0.2–1.2)
BUN: 13 mg/dL (ref 7–25)
CALCIUM: 9.3 mg/dL (ref 8.6–10.3)
CO2: 28 mmol/L (ref 20–31)
Chloride: 102 mmol/L (ref 98–110)
Creat: 0.88 mg/dL (ref 0.60–1.35)
Glucose, Bld: 101 mg/dL — ABNORMAL HIGH (ref 65–99)
POTASSIUM: 3.3 mmol/L — AB (ref 3.5–5.3)
Sodium: 141 mmol/L (ref 135–146)
TOTAL PROTEIN: 6.9 g/dL (ref 6.1–8.1)

## 2015-12-31 LAB — CD4/CD8 (T-HELPER/T-SUPPRESSOR CELL)
CD4%: 23.7
CD4: 758
CD8 T CELL SUPPRESSOR: 42.9
CD8: 1373

## 2015-12-31 LAB — HIV-1 RNA QUANT-NO REFLEX-BLD

## 2015-12-31 MED ORDER — ACYCLOVIR 400 MG PO TABS: 400.0000 mg | ORAL_TABLET | ORAL | Status: DC

## 2015-12-31 NOTE — Progress Notes (Signed)
[name] is here for week 144 A5321 study visit. Complaining of visual changes. States that he feels like he is looking through a "cloud". States that this has been going on for quite a while. Has not had a eye exam in several years. States that he can see "bumps" on his eyes. None noted on exam today.  Denies any discomfort/pain. Agreed to follow up with Dr. Daiva EvesVan Dam. Next study visit scheduled in May for [phone] [id number].

## 2016-01-01 LAB — HEPATITIS C ANTIBODY: HCV AB: NEGATIVE

## 2016-01-19 ENCOUNTER — Encounter: Payer: Self-pay | Admitting: Infectious Disease

## 2016-02-03 ENCOUNTER — Ambulatory Visit (INDEPENDENT_AMBULATORY_CARE_PROVIDER_SITE_OTHER): Payer: Self-pay | Admitting: Infectious Disease

## 2016-02-03 ENCOUNTER — Encounter: Payer: Self-pay | Admitting: Infectious Disease

## 2016-02-03 VITALS — BP 127/79 | HR 77 | Temp 98.0°F | Ht 68.0 in | Wt 211.0 lb

## 2016-02-03 DIAGNOSIS — M545 Low back pain, unspecified: Secondary | ICD-10-CM

## 2016-02-03 DIAGNOSIS — R7989 Other specified abnormal findings of blood chemistry: Secondary | ICD-10-CM

## 2016-02-03 DIAGNOSIS — H538 Other visual disturbances: Secondary | ICD-10-CM

## 2016-02-03 DIAGNOSIS — E291 Testicular hypofunction: Secondary | ICD-10-CM

## 2016-02-03 DIAGNOSIS — B20 Human immunodeficiency virus [HIV] disease: Secondary | ICD-10-CM

## 2016-02-03 DIAGNOSIS — I10 Essential (primary) hypertension: Secondary | ICD-10-CM

## 2016-02-03 DIAGNOSIS — M79604 Pain in right leg: Secondary | ICD-10-CM

## 2016-02-03 HISTORY — DX: Other visual disturbances: H53.8

## 2016-02-03 MED ORDER — VALACYCLOVIR HCL 1 G PO TABS: 1000.0000 mg | ORAL_TABLET | Freq: Two times a day (BID) | ORAL | Status: DC

## 2016-02-03 MED ORDER — VALACYCLOVIR HCL 1 G PO TABS: 1000.0000 mg | ORAL_TABLET | Freq: Every day | ORAL | Status: DC

## 2016-02-03 MED ORDER — CYCLOBENZAPRINE HCL 5 MG PO TABS: 5.0000 mg | ORAL_TABLET | Freq: Every evening | ORAL | Status: DC | PRN

## 2016-02-03 NOTE — Progress Notes (Signed)
Chief complaint: persistent problems with pruritic feet though better now with less heat And now new complaint of lower back pain and fevers   Subjective:    Patient ID: [name], male    DOB: 1971-07-28, 45 y.o.   MRN: [id number]  HPI   45 year-old [place] man previously perfectly controlled on Sustiva and Truvada --> Atripla. --> STRIBILD to [place].  Lab Results  Component Value Date   HIV1RNAQUANT <20 07/29/2015   HIV1RNAQUANT <20 10/23/2013   HIV1RNAQUANT <20 02/22/2013     Lab Results  Component Value Date   CD4TABS 758 12/31/2015   CD4TABS 990 07/29/2015   CD4TABS 595 12/12/2014   He is on testosterone replacement.  . At the end of his LAST  visit with me he stated that he had "one more thing" and this was new onset of lower back pain and fevers with onset on Friday piror to his visit with me and for which I had given him flexeril. At that time he also had told me that he had been given an anti-anxiety agent and muscle relaxant for this in the ED before that visit.  He returns to clinic today and when I first walked into the room appeared to be sleeping and in no distress. During the interview he began to hold his back and complain of pain there that was new but then stated that it keeps coming and going. "I don't think it is a muscle, I think it is a bone."  He also c/o BV esp in the am. He still has not established himself with PCP.  Past Medical History  Diagnosis Date  . HIV infection ([place])   . Hypertension   . Depression   . Low testosterone 02/24/2015  . HIV disease ([place]) 02/24/2015  . Tinea pedis 07/29/2015  . Fever, unspecified 07/29/2015  . Lower back pain 07/29/2015  . Blurry vision 02/03/2016    No past surgical history on file. No surgeries  No family history on file.  Mother with Htn   Social History   Social History  . Marital Status: Legally Separated    Spouse Name: N/A  . Number of Children: N/A  . Years of Education: N/A   Social  History Main Topics  . Smoking status: Current Every Day Smoker -- 0.50 packs/day    Types: Cigarettes  . Smokeless tobacco: Never Used     Comment: pt. not ready to quit smoking at this time  . Alcohol Use: 1.8 oz/week    3 Standard drinks or equivalent per week     Comment: socially  . Drug Use: No  . Sexual Activity: Not Currently     Comment: delcined condoms   Other Topics Concern  . None   Social History Narrative    No Known Allergies   Current outpatient prescriptions:  .  Abacavir-Dolutegravir-Lamivud (TRIUMEQ) 600-50-300 MG TABS, Take 1 tablet by mouth daily., Disp: 30 tablet, Rfl: 5 .  fluconazole (DIFLUCAN) 100 MG tablet, TAKE 1 TABLET(100 MG) BY MOUTH DAILY, Disp: 30 tablet, Rfl: 0 .  testosterone (ANDROGEL) 50 MG/5GM (1%) GEL, Place 5 g onto the skin daily., Disp: 150 g, Rfl: 5 .  TRIUMEQ 600-50-300 MG TABS, TAKE 1 TABLET BY MOUTH DAILY, Disp: 30 tablet, Rfl: 6 .  TRIUMEQ 600-50-300 MG TABS, TAKE 1 TABLET BY MOUTH DAILY, Disp: 30 tablet, Rfl: 6 .  cyclobenzaprine (FLEXERIL) 5 MG tablet, Take 1 tablet (5 mg total) by mouth at bedtime as needed for muscle  spasms., Disp: 30 tablet, Rfl: 0 .  hydrochlorothiazide (HYDRODIURIL) 25 MG tablet, Take 1 tablet (25 mg total) by mouth daily., Disp: 30 tablet, Rfl: 11 .  valACYclovir (VALTREX) 1000 MG tablet, Take 1 tablet (1,000 mg total) by mouth daily., Disp: 30 tablet, Rfl: 5      Review of Systems  Constitutional: Positive for fatigue. Negative for fever, chills, diaphoresis, activity change, appetite change and unexpected weight change.  HENT: Negative for congestion, sinus pressure, sneezing and trouble swallowing.   Eyes: Positive for visual disturbance. Negative for photophobia.  Respiratory: Negative for cough, chest tightness and stridor.   Cardiovascular: Negative for palpitations.  Gastrointestinal: Negative for nausea, diarrhea, constipation, blood in stool, abdominal distention and anal bleeding.    Genitourinary: Negative for dysuria, hematuria, flank pain and difficulty urinating.  Musculoskeletal: Positive for back pain, arthralgias and gait problem. Negative for myalgias and joint swelling.  Skin: Positive for rash. Negative for color change, pallor and wound.  Neurological: Negative for dizziness, tremors, weakness and light-headedness.  Hematological: Negative for adenopathy. Does not bruise/bleed easily.  Psychiatric/Behavioral: Negative for behavioral problems, confusion, sleep disturbance and agitation.       Objective:   Physical Exam  Constitutional: He is oriented to person, place, and time. He appears well-developed and well-nourished. No distress.  HENT:  Head: Normocephalic and atraumatic.  Mouth/Throat: Oropharynx is clear and moist. No oropharyngeal exudate.  Eyes: Conjunctivae and EOM are normal. No scleral icterus.  Neck: Normal range of motion. Neck supple. No JVD present.  Cardiovascular: Normal rate, regular rhythm and normal heart sounds.  Exam reveals no gallop and no friction rub.   No murmur heard. Pulmonary/Chest: Effort normal and breath sounds normal. No respiratory distress. He has no wheezes. He has no rales. He exhibits no tenderness. Left breast exhibits tenderness.  Abdominal: He exhibits no distension and no mass. There is no tenderness. There is no rebound and no guarding.  Musculoskeletal: He exhibits no edema or tenderness.       Back:  Lymphadenopathy:    He has no cervical adenopathy.  Neurological: He is alert and oriented to person, place, and time. He exhibits normal muscle tone. Coordination normal.  Skin: Skin is warm. He is not diaphoretic. No erythema. No pallor.  Psychiatric: He has a normal mood and affect. His behavior is normal. Judgment and thought content normal.   lesions on ankles and legs:       Assessment & Plan:   Lower back pain: When I last saw him he also was endorsing fever and I had obtained ESR, CRP, blood  cultures which were all unrevealing. I unfortunately suspect there could be motive for meds here but there could be actual pathology. For him to get radiographs including Xrays MRI he needs [facility] card. I put in order for Xrays. I gave him a rx for flexeril but no refills and stipulated no more refills from me and that he would need to establish with PCP  HIV: continue TRIUMEQ, renew ADAP in the July period  Low testosterone: continue therapy, check testosterone level with next labs   HTN: conntinue HCTZ well  Controlled  Blurry vision: Not clear why he would have this though he may need glasses. Again encouraged him to obtain [facility] card be seen by PCP and then be able to have access to other services including opthalmology, optometry. He did not have DM by A1c YET when last checked. I would recommend him going to [facility] where they have retinal  scanning device I believe.   CV risk on REPRIEVE study   I spent greater than 25  minutes with the patient including greater than 50% of time in face to face counsel of the patient re his lower back pain,  his HIV, his HTN, his blurry vision  and in coordination of his care.

## 2016-02-19 ENCOUNTER — Encounter (INDEPENDENT_AMBULATORY_CARE_PROVIDER_SITE_OTHER): Payer: Self-pay | Admitting: *Deleted

## 2016-02-19 VITALS — BP 119/80 | HR 67 | Temp 98.0°F | Resp 16 | Wt 207.2 lb

## 2016-02-19 DIAGNOSIS — Z006 Encounter for examination for normal comparison and control in clinical research program: Secondary | ICD-10-CM

## 2016-02-19 NOTE — Progress Notes (Signed)
[name] is here for his week 168 visit for The HAILO Study: A Long Term follow-up of Older HIV-Infected Adults in the ACTG, an observational study addressing the issues of aging, HIV infection and Inflammation. He was upset about his visit with Dr. Daiva EvesVan Dam last month. He said that his back was hurting him and Dr. Daiva EvesVan Dam wouldn't help him. Frommelt has a lot of difficulty understanding english and may have misconstrued the communication. I have made a referral to [facility] so that Tingley can get established there for his primary care. I told him he only needs to come here for his HIV Care and he still needs to make sure he keeps his Juanell Fairlyyan White and ADAP up to date. He said he took the flexeril a few times but did not like the way it made him feel. He says his back is now better. He did say he needed to get his eyes checked, because he was having trouble seeing. He will return for us in Sept. For the A5321 study.

## 2016-02-23 ENCOUNTER — Other Ambulatory Visit: Payer: Self-pay | Admitting: Infectious Disease

## 2016-02-23 DIAGNOSIS — R7989 Other specified abnormal findings of blood chemistry: Secondary | ICD-10-CM

## 2016-03-01 ENCOUNTER — Ambulatory Visit (INDEPENDENT_AMBULATORY_CARE_PROVIDER_SITE_OTHER): Payer: Self-pay | Admitting: Internal Medicine

## 2016-03-01 ENCOUNTER — Encounter: Payer: Self-pay | Admitting: Internal Medicine

## 2016-03-01 VITALS — BP 136/81 | HR 74 | Temp 98.2°F | Ht 68.0 in | Wt 206.9 lb

## 2016-03-01 DIAGNOSIS — M545 Low back pain: Secondary | ICD-10-CM

## 2016-03-01 DIAGNOSIS — H538 Other visual disturbances: Secondary | ICD-10-CM

## 2016-03-01 DIAGNOSIS — M5440 Lumbago with sciatica, unspecified side: Secondary | ICD-10-CM

## 2016-03-01 MED ORDER — KETOTIFEN FUMARATE 0.025 % OP SOLN: 1.0000 [drp] | Freq: Two times a day (BID) | OPHTHALMIC | Status: DC

## 2016-03-01 MED ORDER — DICLOFENAC SODIUM 1 % TD GEL: 4.0000 g | Freq: Four times a day (QID) | TRANSDERMAL | Status: DC

## 2016-03-01 NOTE — Assessment & Plan Note (Signed)
A: It appears to be musculoskeletal back pain on my exam, although he is not complaining of any pain at rest.  P: Voltaren gel

## 2016-03-01 NOTE — Assessment & Plan Note (Signed)
A: Last A1c 6.0, likely not high enough to induce diabetic retinopathy. Last RPR non-reactive. Patient likely sufferers from near-sightedness, astigmatism, open angle glaucoma, or cataracts. He will require a referral to an ophthalmologist or optometrist; however, he will require an orange card (financial assistance) as he is not willing to pay out of pocket at this time.  P: Optho referral pending orange card approval

## 2016-03-01 NOTE — Progress Notes (Signed)
   Subjective:    Patient ID: [name], male    DOB: [birth year], 45 y.o.   MRN: [id number]  HPI  [name] is a 45 year old man with a PMH of controlled HIV who comes to the clinic today to discuss persistent low back pain and blurry vision. He has feeling the low back pain for years and is worsened by bending over or sitting for long periods. It does not radiate down his legs. He says he is not in pain now. He also describes some blurry vision that has been bothering him for months, if not longer. He denies any double vision. He has never seen an eye doctor. He denies a history of diabetes.   Review of Systems  HENT: Positive for sneezing. Negative for congestion and rhinorrhea.   Eyes: Positive for redness, itching and visual disturbance. Negative for photophobia and pain.  Respiratory: Negative for cough and shortness of breath.   Cardiovascular: Negative for chest pain and palpitations.  Musculoskeletal: Positive for back pain. Negative for arthralgias.  Neurological: Negative for syncope, light-headedness and headaches.  Psychiatric/Behavioral: Negative for dysphoric mood. The patient is not nervous/anxious.        Objective:   Physical Exam  Constitutional: He appears well-developed. No distress.  HENT:  Mouth/Throat: Oropharynx is clear and moist. No oropharyngeal exudate.  Eyes: Conjunctivae are normal. Pupils are equal, round, and reactive to light. Right eye exhibits no discharge and no exudate. Left eye exhibits no discharge and no exudate. No scleral icterus. Right eye exhibits normal extraocular motion and no nystagmus. Left eye exhibits normal extraocular motion and no nystagmus.  Musculoskeletal:       Lumbar back: He exhibits tenderness. He exhibits normal range of motion, no bony tenderness and no deformity.  Vitals reviewed.  Eyes: Decreased visual acuity in both eyes.       Assessment & Plan:   Please see problem based assessment and plan for details.

## 2016-03-01 NOTE — Patient Instructions (Signed)
Mr. Anthony CumminsSow,  Your blurry vision is most likely due to vision changes we get as we get older. You need to see an eye doctor. After your orange card has gone through, we can start on a referral. Allergic symptoms may be contributing to this, so you can use ANTIHISTAMINE EYE DROPS.  For your back pain, we have prescribed Voltaren gel. Please use the discount card.  Have a great day.

## 2016-03-02 NOTE — Progress Notes (Signed)
[facility] Attending  Case discussed with Dr. Ford at the time of the visit.  We reviewed the resident's history and exam and pertinent patient test results.  I agree with the assessment, diagnosis, and plan of care documented in the resident's note.  

## 2016-04-01 ENCOUNTER — Other Ambulatory Visit: Payer: Self-pay | Admitting: Infectious Disease

## 2016-04-01 DIAGNOSIS — R7989 Other specified abnormal findings of blood chemistry: Secondary | ICD-10-CM

## 2016-04-05 ENCOUNTER — Other Ambulatory Visit: Payer: Self-pay | Admitting: Infectious Disease

## 2016-04-29 ENCOUNTER — Other Ambulatory Visit: Payer: Self-pay | Admitting: Infectious Disease

## 2016-04-30 ENCOUNTER — Other Ambulatory Visit: Payer: Self-pay | Admitting: *Deleted

## 2016-04-30 DIAGNOSIS — R7989 Other specified abnormal findings of blood chemistry: Secondary | ICD-10-CM

## 2016-04-30 MED ORDER — TESTOSTERONE 50 MG/5GM (1%) TD GEL: TRANSDERMAL | Status: DC

## 2016-05-11 ENCOUNTER — Ambulatory Visit: Payer: Self-pay

## 2016-06-24 ENCOUNTER — Encounter (INDEPENDENT_AMBULATORY_CARE_PROVIDER_SITE_OTHER): Payer: Self-pay | Admitting: *Deleted

## 2016-06-24 VITALS — BP 125/84 | HR 66 | Temp 98.2°F | Resp 16 | Wt 219.5 lb

## 2016-06-24 DIAGNOSIS — Z006 Encounter for examination for normal comparison and control in clinical research program: Secondary | ICD-10-CM

## 2016-06-24 DIAGNOSIS — Z23 Encounter for immunization: Secondary | ICD-10-CM

## 2016-06-24 LAB — COMPREHENSIVE METABOLIC PANEL
ALBUMIN: 3.8 g/dL (ref 3.6–5.1)
ALT: 24 U/L (ref 9–46)
AST: 24 U/L (ref 10–40)
Alkaline Phosphatase: 72 U/L (ref 40–115)
BILIRUBIN TOTAL: 0.3 mg/dL (ref 0.2–1.2)
BUN: 15 mg/dL (ref 7–25)
CALCIUM: 8.9 mg/dL (ref 8.6–10.3)
CO2: 22 mmol/L (ref 20–31)
Chloride: 108 mmol/L (ref 98–110)
Creat: 0.93 mg/dL (ref 0.60–1.35)
GLUCOSE: 92 mg/dL (ref 65–99)
POTASSIUM: 3.8 mmol/L (ref 3.5–5.3)
Sodium: 139 mmol/L (ref 135–146)
Total Protein: 6.4 g/dL (ref 6.1–8.1)

## 2016-06-24 MED ORDER — HYDROCHLOROTHIAZIDE 25 MG PO TABS: 25.0000 mg | ORAL_TABLET | Freq: Every day | ORAL | 5 refills | Status: DC

## 2016-06-24 NOTE — Progress Notes (Signed)
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 He says his lower back has been hurting him since Saturday and he has been taking some tramadol for it that he had left over from a previous prescription. He has been trying to get the "orange card" so he can get in to see an eye doctor, but has been having trouble getting the necessary documentation for it. He also needs a dermatology referral for the chronic rash on his feet. He will be [facility].

## 2016-06-28 ENCOUNTER — Other Ambulatory Visit: Payer: Self-pay | Admitting: Infectious Disease

## 2016-06-28 DIAGNOSIS — B20 Human immunodeficiency virus [HIV] disease: Secondary | ICD-10-CM

## 2016-07-07 LAB — HIV-1 RNA QUANT-NO REFLEX-BLD

## 2016-07-16 ENCOUNTER — Encounter: Payer: Self-pay | Admitting: Infectious Disease

## 2016-07-27 ENCOUNTER — Ambulatory Visit: Payer: Self-pay

## 2016-07-27 ENCOUNTER — Other Ambulatory Visit: Payer: Self-pay | Admitting: *Deleted

## 2016-07-27 DIAGNOSIS — Z21 Asymptomatic human immunodeficiency virus [HIV] infection status: Secondary | ICD-10-CM

## 2016-07-27 MED ORDER — ABACAVIR-DOLUTEGRAVIR-LAMIVUD 600-50-300 MG PO TABS: 1.0000 | ORAL_TABLET | Freq: Every day | ORAL | 5 refills | Status: DC

## 2016-07-27 NOTE — Telephone Encounter (Signed)
[facility] Application. 

## 2016-07-29 ENCOUNTER — Encounter: Payer: Self-pay | Admitting: Infectious Disease

## 2016-08-02 ENCOUNTER — Telehealth: Payer: Self-pay | Admitting: *Deleted

## 2016-08-02 NOTE — Telephone Encounter (Signed)
Called patient to advise that his medication is ready to pick up.

## 2016-09-02 ENCOUNTER — Encounter (INDEPENDENT_AMBULATORY_CARE_PROVIDER_SITE_OTHER): Payer: Self-pay | Admitting: *Deleted

## 2016-09-02 VITALS — BP 137/82 | HR 105 | Temp 98.2°F | Ht 68.0 in | Wt 211.8 lb

## 2016-09-02 DIAGNOSIS — Z006 Encounter for examination for normal comparison and control in clinical research program: Secondary | ICD-10-CM

## 2016-09-02 LAB — LIPID PANEL
Cholesterol: 228 mg/dL — ABNORMAL HIGH (ref <200)
HDL: 55 mg/dL (ref >40)
LDL CALC: 136 mg/dL — AB (ref <100)
Total CHOL/HDL Ratio: 4.1 Ratio (ref <5.0)
Triglycerides: 186 mg/dL — ABNORMAL HIGH (ref <150)
VLDL: 37 mg/dL — ABNORMAL HIGH (ref <30)

## 2016-09-02 LAB — CD4/CD8 (T-HELPER/T-SUPPRESSOR CELL)
CD4%: 25.8
CD4: 903
CD8 % Suppressor T Cell: 40.5
CD8: 1418

## 2016-09-02 LAB — COMPREHENSIVE METABOLIC PANEL
ALT: 23 U/L (ref 9–46)
AST: 22 U/L (ref 10–40)
Albumin: 4.1 g/dL (ref 3.6–5.1)
Alkaline Phosphatase: 84 U/L (ref 40–115)
BILIRUBIN TOTAL: 0.6 mg/dL (ref 0.2–1.2)
BUN: 10 mg/dL (ref 7–25)
CALCIUM: 9.3 mg/dL (ref 8.6–10.3)
CO2: 24 mmol/L (ref 20–31)
CREATININE: 0.95 mg/dL (ref 0.60–1.35)
Chloride: 103 mmol/L (ref 98–110)
GLUCOSE: 100 mg/dL — AB (ref 65–99)
Potassium: 3.7 mmol/L (ref 3.5–5.3)
SODIUM: 139 mmol/L (ref 135–146)
Total Protein: 7.6 g/dL (ref 6.1–8.1)

## 2016-09-02 LAB — CREATININE, URINE, RANDOM: CREATININE, URINE: 376 mg/dL — AB (ref 20–370)

## 2016-09-02 LAB — PROTEIN, URINE, RANDOM: TOTAL PROTEIN, URINE: 15 mg/dL (ref 5–25)

## 2016-09-02 LAB — HEPATITIS C ANTIBODY: HCV AB: NEGATIVE

## 2016-09-02 NOTE — Progress Notes (Signed)
[name] is here for his week 192 visit fro The HAILO Study: A Long Term follow-up of Older HIV-Infected Adults in the ACTG, an observational study addressing the issues of aging, HIV infection and Inflammation.  He denies any new problems or medications and says he feels fine. He still has tingling in his fingers and toes at times, but it has not gotten worse. He will return in January for the A5321 study visit.

## 2016-09-03 LAB — HEMOGLOBIN A1C
Hgb A1c MFr Bld: 5.5 % (ref <5.7)
Mean Plasma Glucose: 111 mg/dL

## 2016-09-16 ENCOUNTER — Encounter: Payer: Self-pay | Admitting: Infectious Disease

## 2016-11-20 ENCOUNTER — Other Ambulatory Visit: Payer: Self-pay | Admitting: Infectious Disease

## 2016-11-20 DIAGNOSIS — B20 Human immunodeficiency virus [HIV] disease: Secondary | ICD-10-CM

## 2017-01-04 ENCOUNTER — Encounter (INDEPENDENT_AMBULATORY_CARE_PROVIDER_SITE_OTHER): Payer: Self-pay | Admitting: *Deleted

## 2017-01-04 VITALS — BP 126/79 | HR 78 | Temp 98.5°F | Wt 210.0 lb

## 2017-01-04 DIAGNOSIS — Z006 Encounter for examination for normal comparison and control in clinical research program: Secondary | ICD-10-CM

## 2017-01-04 LAB — COMPREHENSIVE METABOLIC PANEL
ALT: 28 U/L (ref 9–46)
AST: 24 U/L (ref 10–40)
Albumin: 4.2 g/dL (ref 3.6–5.1)
Alkaline Phosphatase: 95 U/L (ref 40–115)
BILIRUBIN TOTAL: 0.5 mg/dL (ref 0.2–1.2)
BUN: 15 mg/dL (ref 7–25)
CALCIUM: 9.2 mg/dL (ref 8.6–10.3)
CHLORIDE: 101 mmol/L (ref 98–110)
CO2: 26 mmol/L (ref 20–31)
CREATININE: 1.01 mg/dL (ref 0.60–1.35)
GLUCOSE: 99 mg/dL (ref 65–99)
Potassium: 3.9 mmol/L (ref 3.5–5.3)
SODIUM: 139 mmol/L (ref 135–146)
Total Protein: 7.2 g/dL (ref 6.1–8.1)

## 2017-01-04 NOTE — Progress Notes (Signed)
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  He says he is doing well and doesn't have any new problems. He will be returning in May for the next study visit for A5322.

## 2017-01-05 LAB — HEPATITIS C ANTIBODY: HCV Ab: NEGATIVE

## 2017-01-21 ENCOUNTER — Encounter: Payer: Self-pay | Admitting: *Deleted

## 2017-01-21 LAB — HIV-1 RNA QUANT-NO REFLEX-BLD: HIV-1 RNA Viral Load: <40

## 2017-01-21 LAB — CD4/CD8 (T-HELPER/T-SUPPRESSOR CELL)
CD4 % Helper T Cell: 24.4
CD4 Count: 683
CD8 T CELL SUPPRESSOR: 42.4
CD8 T Cell Abs: 1187

## 2017-01-28 ENCOUNTER — Other Ambulatory Visit: Payer: Self-pay | Admitting: Infectious Disease

## 2017-01-28 DIAGNOSIS — B20 Human immunodeficiency virus [HIV] disease: Secondary | ICD-10-CM

## 2017-02-09 ENCOUNTER — Other Ambulatory Visit: Payer: Self-pay | Admitting: *Deleted

## 2017-02-09 ENCOUNTER — Ambulatory Visit: Payer: Self-pay

## 2017-02-09 DIAGNOSIS — B20 Human immunodeficiency virus [HIV] disease: Secondary | ICD-10-CM

## 2017-02-09 MED ORDER — ABACAVIR-DOLUTEGRAVIR-LAMIVUD 600-50-300 MG PO TABS: 1.0000 | ORAL_TABLET | Freq: Every day | ORAL | 1 refills | Status: DC

## 2017-02-11 ENCOUNTER — Encounter: Payer: Self-pay | Admitting: Infectious Disease

## 2017-02-19 ENCOUNTER — Other Ambulatory Visit: Payer: Self-pay | Admitting: Infectious Disease

## 2017-02-23 ENCOUNTER — Other Ambulatory Visit: Payer: Self-pay

## 2017-02-23 ENCOUNTER — Encounter: Payer: Self-pay | Admitting: *Deleted

## 2017-02-23 VITALS — BP 132/84 | HR 71 | Temp 98.6°F | Resp 16 | Wt 215.0 lb

## 2017-02-23 DIAGNOSIS — Z006 Encounter for examination for normal comparison and control in clinical research program: Secondary | ICD-10-CM

## 2017-02-23 DIAGNOSIS — Z21 Asymptomatic human immunodeficiency virus [HIV] infection status: Secondary | ICD-10-CM

## 2017-02-23 MED ORDER — HYDROCHLOROTHIAZIDE 25 MG PO TABS: 25.0000 mg | ORAL_TABLET | Freq: Every day | ORAL | 11 refills | Status: DC

## 2017-02-23 MED ORDER — HYDROCHLOROTHIAZIDE 25 MG PO TABS: 25.0000 mg | ORAL_TABLET | Freq: Every day | ORAL | 6 refills | Status: DC

## 2017-02-23 MED ORDER — ABACAVIR-DOLUTEGRAVIR-LAMIVUD 600-50-300 MG PO TABS: 1.0000 | ORAL_TABLET | Freq: Every day | ORAL | 3 refills | Status: DC

## 2017-02-23 NOTE — Progress Notes (Signed)
[name] is here for his week 216 visit for The HAILO Study: A Long Term follow-up of Older HIV-Infected Adults in the ACTG, an observational study addressing the issues of aging, HIV infection and Inflammation. He complains of significant lower back pain and feeling feverish. His temp is 98.6 though. [place] to go back to [place] months the end of June and needs his medications filled for 3 months at a time. He continues to have rashes on his feet and wants to get in to see Internal medicine soon. I gave him the number to call to get scheduled. He returns for research in June for the other study, A5321.

## 2017-03-09 ENCOUNTER — Ambulatory Visit (INDEPENDENT_AMBULATORY_CARE_PROVIDER_SITE_OTHER): Payer: Self-pay | Admitting: Internal Medicine

## 2017-03-09 VITALS — BP 151/95 | HR 74 | Temp 98.5°F | Ht 67.0 in | Wt 213.0 lb

## 2017-03-09 DIAGNOSIS — N62 Hypertrophy of breast: Secondary | ICD-10-CM

## 2017-03-09 DIAGNOSIS — R7989 Other specified abnormal findings of blood chemistry: Secondary | ICD-10-CM

## 2017-03-09 DIAGNOSIS — B20 Human immunodeficiency virus [HIV] disease: Secondary | ICD-10-CM

## 2017-03-09 LAB — COMPLETE METABOLIC PANEL WITH GFR
ALBUMIN: 4 g/dL (ref 3.6–5.1)
ALK PHOS: 91 U/L (ref 40–115)
ALT: 20 U/L (ref 9–46)
AST: 14 U/L (ref 10–40)
BILIRUBIN TOTAL: 0.5 mg/dL (ref 0.2–1.2)
BUN: 9 mg/dL (ref 7–25)
CALCIUM: 9.3 mg/dL (ref 8.6–10.3)
CO2: 25 mmol/L (ref 20–31)
CREATININE: 1.05 mg/dL (ref 0.60–1.35)
Chloride: 108 mmol/L (ref 98–110)
GFR, Est African American: >89 mL/min (ref >=60)
GFR, Est Non African American: 84 mL/min (ref >=60)
GLUCOSE: 94 mg/dL (ref 65–99)
POTASSIUM: 4.2 mmol/L (ref 3.5–5.3)
Sodium: 142 mmol/L (ref 135–146)
TOTAL PROTEIN: 6.9 g/dL (ref 6.1–8.1)

## 2017-03-09 NOTE — Assessment & Plan Note (Signed)
On replacement

## 2017-03-09 NOTE — Assessment & Plan Note (Signed)
Will continue with Triumeq.  Has had recent labs.  rtc 6 months.

## 2017-03-09 NOTE — Assessment & Plan Note (Signed)
Not sure of etiology.  Likely will remain unknown.  I will repeat some of the labs.  He can return to his primary provider in 3 months.

## 2017-03-09 NOTE — Progress Notes (Signed)
   Subjective:    Patient ID: [name], male    DOB: [birth year], 46 y.o.   MRN: [id number]  HPI Here for follow up of HIV and gynecomastia. Has been on Triumeq and no issues. Last labs with a CD4 of 683, viral load < 40.  He continues under a research protocol.  He complains of continued gynecomastia.  Has had some work up before.  Some tingling and discomfort.  He is planning to go to [place] and asking how to get a 90 day supply of Triumeq. No associated n/v/d.  He is on testosterone replacement.    Review of Systems  Constitutional: Negative for fatigue.  Gastrointestinal: Negative for diarrhea.  Neurological: Negative for dizziness.       Objective:   Physical Exam  Constitutional: He appears well-developed and well-nourished. No distress.  Eyes: No scleral icterus.  Cardiovascular: Normal rate, regular rhythm and normal heart sounds.   No murmur heard. Pulmonary/Chest:  + bilateral and equal gynecomastia No breast tenderness, no lumps  Skin: No rash noted.    SH: no tobacco      Assessment & Plan:

## 2017-03-10 LAB — TSH: TSH: 1 mIU/L (ref 0.40–4.50)

## 2017-03-10 LAB — TESTOSTERONE: TESTOSTERONE: 388 ng/dL (ref 250–827)

## 2017-03-10 LAB — ESTRADIOL: Estradiol: 35 pg/mL (ref <=39)

## 2017-03-10 LAB — PROLACTIN: Prolactin: 4 ng/mL (ref 2.0–18.0)

## 2017-03-29 ENCOUNTER — Other Ambulatory Visit: Payer: Self-pay | Admitting: Infectious Disease

## 2017-03-29 DIAGNOSIS — B20 Human immunodeficiency virus [HIV] disease: Secondary | ICD-10-CM

## 2017-04-19 ENCOUNTER — Encounter (INDEPENDENT_AMBULATORY_CARE_PROVIDER_SITE_OTHER): Payer: Self-pay | Admitting: *Deleted

## 2017-04-19 VITALS — BP 121/79 | HR 73 | Temp 98.3°F | Wt 212.0 lb

## 2017-04-19 DIAGNOSIS — Z006 Encounter for examination for normal comparison and control in clinical research program: Secondary | ICD-10-CM

## 2017-04-19 LAB — BASIC METABOLIC PANEL
BUN: 17 mg/dL (ref 7–25)
CALCIUM: 8.8 mg/dL (ref 8.6–10.3)
CO2: 23 mmol/L (ref 20–31)
CREATININE: 0.95 mg/dL (ref 0.60–1.35)
Chloride: 105 mmol/L (ref 98–110)
Glucose, Bld: 93 mg/dL (ref 65–99)
Potassium: 3.4 mmol/L — ABNORMAL LOW (ref 3.5–5.3)
Sodium: 139 mmol/L (ref 135–146)

## 2017-04-19 LAB — HIV-1 RNA QUANT-NO REFLEX-BLD: HIV-1 RNA Viral Load: <40

## 2017-04-19 LAB — CBC WITH DIFFERENTIAL/PLATELET
BASOS PCT: 0 %
Basophils Absolute: 0 cells/uL (ref 0–200)
Eosinophils Absolute: 272 cells/uL (ref 15–500)
Eosinophils Relative: 4 %
HCT: 42.9 % (ref 38.5–50.0)
Hemoglobin: 14.6 g/dL (ref 13.2–17.1)
LYMPHS PCT: 56 %
Lymphs Abs: 3808 cells/uL (ref 850–3900)
MCH: 33.1 pg — ABNORMAL HIGH (ref 27.0–33.0)
MCHC: 34 g/dL (ref 32.0–36.0)
MCV: 97.3 fL (ref 80.0–100.0)
MONOS PCT: 8 %
MPV: 9.1 fL (ref 7.5–12.5)
Monocytes Absolute: 544 cells/uL (ref 200–950)
Neutro Abs: 2176 cells/uL (ref 1500–7800)
Neutrophils Relative %: 32 %
PLATELETS: 242 10*3/uL (ref 140–400)
RBC: 4.41 MIL/uL (ref 4.20–5.80)
RDW: 13.2 % (ref 11.0–15.0)
WBC: 6.8 10*3/uL (ref 3.8–10.8)

## 2017-04-21 NOTE — Progress Notes (Signed)
[id number] study: Decay of HIV-1 Reservoirs in Patients on Long-Term Antiretroviral Therapy: the ACTG HIV Reservoirs [facility]) study Medications: No. Observation only Duration: up to 7 years  [name] is here for week 216. He denies any issues. He is leaving soon to travel back home. Will be gone till September. He is excited. Fasting blood drawn and vitals obtained. Questionnaires completed. He received $50 gift card for visit. Next appointment scheduled for 10/25/2017 @ 930

## 2017-05-18 ENCOUNTER — Encounter: Payer: Self-pay | Admitting: *Deleted

## 2017-06-13 ENCOUNTER — Ambulatory Visit: Payer: Self-pay | Admitting: Internal Medicine

## 2017-09-05 ENCOUNTER — Ambulatory Visit (INDEPENDENT_AMBULATORY_CARE_PROVIDER_SITE_OTHER): Payer: Self-pay | Admitting: Infectious Diseases

## 2017-09-05 ENCOUNTER — Encounter: Payer: Self-pay | Admitting: Infectious Diseases

## 2017-09-05 VITALS — BP 160/89 | HR 89 | Temp 98.2°F | Wt 215.0 lb

## 2017-09-05 DIAGNOSIS — B20 Human immunodeficiency virus [HIV] disease: Secondary | ICD-10-CM

## 2017-09-05 DIAGNOSIS — H9190 Unspecified hearing loss, unspecified ear: Secondary | ICD-10-CM | POA: Insufficient documentation

## 2017-09-05 DIAGNOSIS — H9192 Unspecified hearing loss, left ear: Secondary | ICD-10-CM

## 2017-09-05 DIAGNOSIS — Z0000 Encounter for general adult medical examination without abnormal findings: Secondary | ICD-10-CM | POA: Insufficient documentation

## 2017-09-05 DIAGNOSIS — Z23 Encounter for immunization: Secondary | ICD-10-CM

## 2017-09-05 MED ORDER — PREDNISONE 20 MG PO TABS: 20.0000 mg | ORAL_TABLET | Freq: Two times a day (BID) | ORAL | 0 refills | Status: DC

## 2017-09-05 MED ORDER — PREDNISONE 20 MG PO TABS: 20.0000 mg | ORAL_TABLET | Freq: Two times a day (BID) | ORAL | 0 refills | Status: AC

## 2017-09-05 NOTE — Assessment & Plan Note (Signed)
Flu vaccine today 

## 2017-09-05 NOTE — Progress Notes (Signed)
[name]  [birth year] [id number] Patient, No Pcp Per   Reason for Visit: Routine HIV Care   Brief Narrative: [name] is a 46 y.o. African male with HIV infection that sees Dr. Luciana Axeomer. Currently on Triumeq with well controlled viral load and CD4 683.    HPI:  Noticed some hearing loss over the last 2 weeks from left ear. Denies drainage, pain to canal/tragus/pinnea. Had a URI that lasted 3 weeks with sneezing, cough, nasal congestion. Reports feeling chilly/cold on occasion he assumes to be fever that resolves with aleve. At work or when he runs he reports feeling winded easier and hyperreactive dry cough which improves with rest. Has used Aleve which has helped a little. He is a daily cigarette smoker.   Continues on Triumeq and has not missed any doses.   Review of Systems: Review of Systems  Constitutional: Positive for chills. Negative for fever, malaise/fatigue and weight loss.  HENT: Positive for hearing loss. Negative for ear discharge, ear pain and sore throat.   Respiratory: Positive for cough and shortness of breath. Negative for sputum production.   Cardiovascular: Negative for chest pain and leg swelling.  Gastrointestinal: Negative for abdominal pain, diarrhea and vomiting.  Genitourinary: Negative for dysuria and flank pain.  Musculoskeletal: Negative for joint pain, myalgias and neck pain.  Skin: Negative for rash.  Neurological: Negative for dizziness, tingling and headaches.  Psychiatric/Behavioral: Negative for depression and substance abuse. The patient is not nervous/anxious and does not have insomnia.     Past Medical History:  Diagnosis Date  . Blurry vision 02/03/2016  . Depression   . Fever, unspecified 07/29/2015  . HIV disease ([facility]) 02/24/2015  . HIV infection ([facility])   . Hypertension   . Low testosterone 02/24/2015  . Lower back pain 07/29/2015  . Tinea pedis 07/29/2015    No Known Allergies  Social History   Tobacco Use  . Smoking status: Current  Every Day Smoker    Packs/day: 0.50    Types: Cigarettes  . Smokeless tobacco: Never Used  . Tobacco comment: pt. not ready to quit smoking at this timeA1/2PPD  Substance Use Topics  . Alcohol use: Yes    Alcohol/week: 1.8 oz    Types: 3 Standard drinks or equivalent per week    Comment: socially  . Drug use: No   Physical Exam and Objective Findings:  Vitals:   09/05/17 1137  BP: (!) 160/89  Pulse: 89  Temp: 98.2 F (36.8 C)  TempSrc: Oral  Weight: 215 lb (97.5 kg)   Body mass index is 33.67 kg/m.  Physical Exam  Constitutional: He is oriented to person, place, and time and well-developed, well-nourished, and in no distress.  HENT:  Right Ear: Hearing, tympanic membrane, external ear and ear canal normal.  Left Ear: External ear and ear canal normal. No drainage or tenderness. Tympanic membrane is not injected. A middle ear effusion (opaque ) is present. Decreased hearing is noted.  Nose: Nose normal. No rhinorrhea.  Mouth/Throat: Oropharynx is clear and moist. No oral lesions. Normal dentition. No dental caries.  Eyes: Pupils are equal, round, and reactive to light. Right eye exhibits no discharge. Left eye exhibits no discharge.  Cardiovascular: Normal rate, regular rhythm and normal heart sounds.  Pulmonary/Chest: Effort normal and breath sounds normal.  Lymphadenopathy:    He has no cervical adenopathy.  Neurological: He is alert and oriented to person, place, and time.  Psychiatric: Mood and affect normal.  Vitals reviewed.   Lab Results  Lab Results  Component Value Date   WBC 6.8 04/19/2017   HGB 14.6 04/19/2017   HCT 42.9 04/19/2017   MCV 97.3 04/19/2017   PLT 242 04/19/2017    Lab Results  Component Value Date   CREATININE 0.95 04/19/2017   BUN 17 04/19/2017   NA 139 04/19/2017   K 3.4 (L) 04/19/2017   CL 105 04/19/2017   CO2 23 04/19/2017    Lab Results  Component Value Date   ALT 20 03/09/2017   AST 14 03/09/2017   ALKPHOS 91 03/09/2017    BILITOT 0.5 03/09/2017    Lab Results  Component Value Date   CHOL 228 (H) 09/02/2016   HDL 55 09/02/2016   LDLCALC 136 (H) 09/02/2016   LDLDIRECT 111 (H) 11/28/2012   TRIG 186 (H) 09/02/2016   CHOLHDL 4.1 09/02/2016   HIV 1 RNA Quant (copies/mL)  Date Value  07/29/2015 <20  10/23/2013 <20  02/22/2013 <20   HIV-1 RNA Viral Load (no units)  Date Value  04/19/2017 <40  01/04/2017 <40  06/24/2016 <40   CD4 (no units)  Date Value  09/02/2016 903  12/31/2015 758  12/12/2014 595   CD4 T Cell Abs (/uL)  Date Value  07/29/2015 990   Lab Results  Component Value Date   HAV POS (A) 07/22/2010   Lab Results  Component Value Date   HEPBSAG NEGATIVE 12/12/2014   HEPBSAB POS (A) 12/12/2014   Lab Results  Component Value Date   HCVAB NEGATIVE 01/04/2017   No results found for: CHLAMYDIAWP, N No results found for: GCPROBEAPT No results found for: QUANTGOLD No results found for: RPR    Problem List Items Addressed This Visit      Nervous and Auditory   Hearing loss, left     No foreign body or cerumen impaction. Suspect some eustachian tube dysfunction with some residual cough from URI. Will try short course prednisone, allegra-D short term (elevated BP). No indication for antibiotic based on exam today.         Other   Human immunodeficiency virus (HIV) disease (HCC)    Per Dr. Ephriam Knucklesomer's last OV in May was supposed to follow up around August. Will check VL/CD4 today and get him in for a follow up visit in the next month or so to review and follow up.       Healthcare maintenance    Flu vaccine today        Other Visit Diagnoses    HIV (human immunodeficiency virus infection) (HCC)    -  Primary   Relevant Orders   HIV 1 RNA quant-no reflex-bld   T-helper cell (CD4)- ([facility] clinic only)   Need for immunization against influenza       Relevant Orders   Flu Vaccine QUAD 36+ mos IM (Completed)      Rexene AlbertsStephanie Dixon, MSN, NP-C [facility] Pager: [phone]  09/05/2017  12:19 PM

## 2017-09-05 NOTE — Patient Instructions (Signed)
Claritin-D or Allegra-D to help decongest your ears - over the counter generic brand works great.   Start Prednisone with the Claritin or Allegra-D one tablet twice a day to help with inflammation.    Can take some Aleve / Naproxen Sodium 1 pill twice a day for any pain you may have   Follow up with Dr. Luciana Axeomer as previously scheduled or sooner if needed.

## 2017-09-05 NOTE — Assessment & Plan Note (Signed)
Per Dr. Ephriam Knucklesomer's last OV in May was supposed to follow up around August. Will check VL/CD4 today and get him in for a follow up visit in the next month or so to review and follow up.

## 2017-09-05 NOTE — Assessment & Plan Note (Signed)
No foreign body or cerumen impaction. Suspect some eustachian tube dysfunction with some residual cough from URI. Will try short course prednisone, allegra-D short term (elevated BP). No indication for antibiotic based on exam today.

## 2017-09-06 LAB — T-HELPER CELL (CD4) - (RCID CLINIC ONLY)
CD4 T CELL ABS: 840 /uL (ref 400–2700)
CD4 T CELL HELPER: 22 % — AB (ref 33–55)

## 2017-09-07 LAB — HIV-1 RNA QUANT-NO REFLEX-BLD
HIV 1 RNA Quant: <20 copies/mL
HIV-1 RNA QUANT, LOG: NOT DETECTED {Log_copies}/mL

## 2017-09-19 ENCOUNTER — Ambulatory Visit: Payer: Self-pay | Admitting: Internal Medicine

## 2017-09-20 ENCOUNTER — Encounter: Payer: Self-pay | Admitting: *Deleted

## 2017-09-29 ENCOUNTER — Encounter (INDEPENDENT_AMBULATORY_CARE_PROVIDER_SITE_OTHER): Payer: Self-pay | Admitting: *Deleted

## 2017-09-29 VITALS — BP 146/86 | HR 80 | Temp 97.9°F | Ht 68.0 in | Wt 219.5 lb

## 2017-09-29 DIAGNOSIS — Z006 Encounter for examination for normal comparison and control in clinical research program: Secondary | ICD-10-CM

## 2017-09-29 NOTE — Progress Notes (Signed)
[name] is here for his week 240 visit for The HAILO Study: A Long Term follow-up of Older HIV-Infected Adults in the ACTG, an observational study addressing the issues of aging, HIV infection and Inflammation. He continues to take his HIV med and Bp med without problems. He reports 2 new rashes that are itchy, one on his abdomen and one on his rt thigh. He continues to have the rash on his left foot. He does report that his vision is blurry and needs to have his eyes checked. He will be returning in January to see Dr. Daiva EvesVan Dam and research again.

## 2017-09-30 LAB — COMPREHENSIVE METABOLIC PANEL
AG RATIO: 1.2 (calc) (ref 1.0–2.5)
ALBUMIN MSPROF: 3.8 g/dL (ref 3.6–5.1)
ALKALINE PHOSPHATASE (APISO): 84 U/L (ref 40–115)
ALT: 20 U/L (ref 9–46)
AST: 16 U/L (ref 10–40)
BUN: 15 mg/dL (ref 7–25)
CHLORIDE: 104 mmol/L (ref 98–110)
CO2: 27 mmol/L (ref 20–32)
CREATININE: 0.95 mg/dL (ref 0.60–1.35)
Calcium: 9.2 mg/dL (ref 8.6–10.3)
GLOBULIN: 3.2 g/dL (ref 1.9–3.7)
GLUCOSE: 92 mg/dL (ref 65–99)
POTASSIUM: 3.7 mmol/L (ref 3.5–5.3)
Sodium: 140 mmol/L (ref 135–146)
Total Bilirubin: 0.6 mg/dL (ref 0.2–1.2)
Total Protein: 7 g/dL (ref 6.1–8.1)

## 2017-09-30 LAB — LIPID PANEL
CHOL/HDL RATIO: 3.6 (calc) (ref <5.0)
Cholesterol: 217 mg/dL — ABNORMAL HIGH (ref <200)
HDL: 60 mg/dL (ref >40)
LDL Cholesterol (Calc): 123 mg/dL (calc) — ABNORMAL HIGH
NON-HDL CHOLESTEROL (CALC): 157 mg/dL — AB (ref <130)
Triglycerides: 219 mg/dL — ABNORMAL HIGH (ref <150)

## 2017-09-30 LAB — PROTEIN / CREATININE RATIO, URINE
Creatinine, Urine: 206 mg/dL (ref 20–320)
PROTEIN/CREAT RATIO: 63 mg/g{creat} (ref 22–128)
Total Protein, Urine: 13 mg/dL (ref 5–25)

## 2017-09-30 LAB — HEMOGLOBIN A1C
HEMOGLOBIN A1C: 5.7 %{Hb} — AB (ref <5.7)
Mean Plasma Glucose: 117 (calc)
eAG (mmol/L): 6.5 (calc)

## 2017-10-15 ENCOUNTER — Other Ambulatory Visit: Payer: Self-pay | Admitting: Internal Medicine

## 2017-10-15 DIAGNOSIS — B20 Human immunodeficiency virus [HIV] disease: Secondary | ICD-10-CM

## 2017-11-07 ENCOUNTER — Ambulatory Visit: Payer: Self-pay | Admitting: Infectious Disease

## 2017-11-09 ENCOUNTER — Encounter (INDEPENDENT_AMBULATORY_CARE_PROVIDER_SITE_OTHER): Payer: Self-pay | Admitting: *Deleted

## 2017-11-09 ENCOUNTER — Encounter: Payer: Self-pay | Admitting: Infectious Disease

## 2017-11-09 ENCOUNTER — Ambulatory Visit (INDEPENDENT_AMBULATORY_CARE_PROVIDER_SITE_OTHER): Payer: Self-pay | Admitting: Infectious Disease

## 2017-11-09 VITALS — BP 116/76 | HR 73 | Temp 98.4°F | Wt 220.5 lb

## 2017-11-09 DIAGNOSIS — Z23 Encounter for immunization: Secondary | ICD-10-CM

## 2017-11-09 DIAGNOSIS — B20 Human immunodeficiency virus [HIV] disease: Secondary | ICD-10-CM

## 2017-11-09 DIAGNOSIS — Z006 Encounter for examination for normal comparison and control in clinical research program: Secondary | ICD-10-CM

## 2017-11-09 MED ORDER — ABACAVIR-DOLUTEGRAVIR-LAMIVUD 600-50-300 MG PO TABS
1.0000 | ORAL_TABLET | Freq: Every day | ORAL | 5 refills | Status: DC
Start: 2017-11-09 — End: 2018-07-03

## 2017-11-09 MED ORDER — ABACAVIR-DOLUTEGRAVIR-LAMIVUD 600-50-300 MG PO TABS: 1.0000 | ORAL_TABLET | Freq: Every day | ORAL | 5 refills | Status: DC
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
## 2017-11-09 NOTE — Progress Notes (Signed)
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 He currently has a cold and says he has been coughing a lot. He is concerned because [facility] has not called him to let him know his meds are ready and he only has 2 pills left. I told him to just go by there and ask about them. He is also seeing Dr. Daiva EvesVan Dam today. He will be returning the first of April for the A5322 study.
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
## 2017-11-10 LAB — COMPREHENSIVE METABOLIC PANEL
AG Ratio: 1.4 (calc) (ref 1.0–2.5)
ALT: 30 U/L (ref 9–46)
AST: 27 U/L (ref 10–40)
Albumin: 4.2 g/dL (ref 3.6–5.1)
Alkaline phosphatase (APISO): 98 U/L (ref 40–115)
BUN: 11 mg/dL (ref 7–25)
CO2: 25 mmol/L (ref 20–32)
CREATININE: 0.97 mg/dL (ref 0.60–1.35)
Calcium: 9.1 mg/dL (ref 8.6–10.3)
Chloride: 103 mmol/L (ref 98–110)
GLUCOSE: 90 mg/dL (ref 65–99)
Globulin: 3.1 g/dL (calc) (ref 1.9–3.7)
Potassium: 3.9 mmol/L (ref 3.5–5.3)
SODIUM: 140 mmol/L (ref 135–146)
TOTAL PROTEIN: 7.3 g/dL (ref 6.1–8.1)
Total Bilirubin: 0.3 mg/dL (ref 0.2–1.2)

## 2017-11-10 LAB — HEPATITIS C ANTIBODY
Hepatitis C Ab: NONREACTIVE
SIGNAL TO CUT-OFF: 0.01 (ref <1.00)

## 2017-11-10 LAB — CD4/CD8 (T-HELPER/T-SUPPRESSOR CELL)
CD4 Count: 861
CD4 T CELL HELPER: 31.9
CD8 T CELL SUPPRESSOR: 39.5
CD8 T Cell Abs: 1067

## 2017-11-11 ENCOUNTER — Encounter: Payer: Self-pay | Admitting: Infectious Disease

## 2017-11-19 ENCOUNTER — Other Ambulatory Visit: Payer: Self-pay | Admitting: Infectious Disease

## 2017-11-21 ENCOUNTER — Other Ambulatory Visit: Payer: Self-pay | Admitting: Pharmacist

## 2017-11-21 LAB — HIV-1 RNA QUANT-NO REFLEX-BLD

## 2017-11-29 ENCOUNTER — Telehealth: Payer: Self-pay | Admitting: *Deleted

## 2017-11-29 NOTE — Telephone Encounter (Signed)
[facility] are [facility] for pickup. [facility] has tried unsuccessfully to get in touch with patient. RN left message, asking him to please come today to pick them up, that the dental staff would work him in.  This is the last dental clinic until April, maybe May. Andree CossHowell, Kenderick Kobler M, RN

## 2017-12-07 ENCOUNTER — Encounter: Payer: Self-pay | Admitting: *Deleted

## 2017-12-12 ENCOUNTER — Other Ambulatory Visit: Payer: Self-pay

## 2017-12-12 DIAGNOSIS — B20 Human immunodeficiency virus [HIV] disease: Secondary | ICD-10-CM

## 2017-12-13 LAB — COMPLETE METABOLIC PANEL WITH GFR
AG Ratio: 1.4 (calc) (ref 1.0–2.5)
ALBUMIN MSPROF: 4.4 g/dL (ref 3.6–5.1)
ALKALINE PHOSPHATASE (APISO): 101 U/L (ref 40–115)
ALT: 23 U/L (ref 9–46)
AST: 17 U/L (ref 10–40)
BUN: 8 mg/dL (ref 7–25)
CO2: 24 mmol/L (ref 20–32)
CREATININE: 0.92 mg/dL (ref 0.60–1.35)
Calcium: 9.3 mg/dL (ref 8.6–10.3)
Chloride: 103 mmol/L (ref 98–110)
GFR, Est African American: 114 mL/min/{1.73_m2} (ref >=60)
GFR, Est Non African American: 98 mL/min/{1.73_m2} (ref >=60)
GLOBULIN: 3.1 g/dL (ref 1.9–3.7)
GLUCOSE: 88 mg/dL (ref 65–99)
Potassium: 3.6 mmol/L (ref 3.5–5.3)
SODIUM: 138 mmol/L (ref 135–146)
Total Bilirubin: 0.6 mg/dL (ref 0.2–1.2)
Total Protein: 7.5 g/dL (ref 6.1–8.1)

## 2017-12-13 LAB — CBC WITH DIFFERENTIAL/PLATELET
BASOS PCT: 0.7 %
Basophils Absolute: 49 cells/uL (ref 0–200)
Eosinophils Absolute: 147 cells/uL (ref 15–500)
Eosinophils Relative: 2.1 %
HEMATOCRIT: 40.2 % (ref 38.5–50.0)
HEMOGLOBIN: 14.5 g/dL (ref 13.2–17.1)
LYMPHS ABS: 3738 {cells}/uL (ref 850–3900)
MCH: 34.1 pg — AB (ref 27.0–33.0)
MCHC: 36.1 g/dL — ABNORMAL HIGH (ref 32.0–36.0)
MCV: 94.6 fL (ref 80.0–100.0)
MPV: 9.9 fL (ref 7.5–12.5)
Monocytes Relative: 9.4 %
Neutro Abs: 2408 cells/uL (ref 1500–7800)
Neutrophils Relative %: 34.4 %
PLATELETS: 241 10*3/uL (ref 140–400)
RBC: 4.25 10*6/uL (ref 4.20–5.80)
RDW: 12.1 % (ref 11.0–15.0)
Total Lymphocyte: 53.4 %
WBC: 7 10*3/uL (ref 3.8–10.8)
WBCMIX: 658 {cells}/uL (ref 200–950)

## 2017-12-13 LAB — RPR: RPR Ser Ql: NONREACTIVE

## 2017-12-13 LAB — T-HELPER CELL (CD4) - (RCID CLINIC ONLY)
CD4 T CELL HELPER: 24 % — AB (ref 33–55)
CD4 T Cell Abs: 960 /uL (ref 400–2700)

## 2017-12-14 LAB — HIV-1 RNA ULTRAQUANT REFLEX TO GENTYP+: HIV 1 RNA Quant: <20 Copies/mL — ABNORMAL HIGH

## 2017-12-26 ENCOUNTER — Encounter: Payer: Self-pay | Admitting: Infectious Disease

## 2017-12-26 ENCOUNTER — Ambulatory Visit (INDEPENDENT_AMBULATORY_CARE_PROVIDER_SITE_OTHER): Payer: Self-pay | Admitting: Infectious Disease

## 2017-12-26 VITALS — BP 147/90 | HR 87 | Temp 98.1°F | Ht 68.0 in | Wt 221.0 lb

## 2017-12-26 DIAGNOSIS — Z79899 Other long term (current) drug therapy: Secondary | ICD-10-CM

## 2017-12-26 DIAGNOSIS — B20 Human immunodeficiency virus [HIV] disease: Secondary | ICD-10-CM

## 2017-12-26 DIAGNOSIS — I10 Essential (primary) hypertension: Secondary | ICD-10-CM

## 2017-12-26 DIAGNOSIS — Z113 Encounter for screening for infections with a predominantly sexual mode of transmission: Secondary | ICD-10-CM
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
## 2018-01-09 ENCOUNTER — Other Ambulatory Visit: Payer: Self-pay | Admitting: Pharmacist

## 2018-01-09 DIAGNOSIS — B20 Human immunodeficiency virus [HIV] disease: Secondary | ICD-10-CM

## 2018-01-17 ENCOUNTER — Encounter (INDEPENDENT_AMBULATORY_CARE_PROVIDER_SITE_OTHER): Payer: Self-pay | Admitting: *Deleted

## 2018-01-17 VITALS — BP 133/90 | HR 79 | Temp 98.0°F | Wt 223.5 lb

## 2018-01-17 DIAGNOSIS — Z006 Encounter for examination for normal comparison and control in clinical research program: Secondary | ICD-10-CM

## 2018-01-17 NOTE — Progress Notes (Signed)
Tetreault is here for his week 264 visit for A5322. He says his lower back has been bothering him a lot over the past 3 days. He is taking Aleve for it. He says he thinks it's because he has been putting on so much weight. He denies any other new problems. He will be coming back in July for the next study appt.

## 2018-01-31 ENCOUNTER — Other Ambulatory Visit: Payer: Self-pay | Admitting: Infectious Disease

## 2018-04-18 ENCOUNTER — Other Ambulatory Visit: Payer: Self-pay

## 2018-04-18 DIAGNOSIS — Z79899 Other long term (current) drug therapy: Secondary | ICD-10-CM

## 2018-04-18 DIAGNOSIS — B20 Human immunodeficiency virus [HIV] disease: Secondary | ICD-10-CM

## 2018-04-18 DIAGNOSIS — I10 Essential (primary) hypertension: Secondary | ICD-10-CM

## 2018-04-18 DIAGNOSIS — Z113 Encounter for screening for infections with a predominantly sexual mode of transmission: Secondary | ICD-10-CM

## 2018-04-19 ENCOUNTER — Encounter: Payer: Self-pay | Admitting: Infectious Disease

## 2018-04-19 LAB — T-HELPER CELL (CD4) - (RCID CLINIC ONLY)
CD4 T CELL ABS: 1160 /uL (ref 400–2700)
CD4 T CELL HELPER: 19 % — AB (ref 33–55)

## 2018-04-19 LAB — CBC WITH DIFFERENTIAL/PLATELET
BASOS PCT: 0.7 %
Basophils Absolute: 62 cells/uL (ref 0–200)
Eosinophils Absolute: 178 cells/uL (ref 15–500)
Eosinophils Relative: 2 %
HEMATOCRIT: 39.4 % (ref 38.5–50.0)
Hemoglobin: 13.9 g/dL (ref 13.2–17.1)
LYMPHS ABS: 5705 {cells}/uL — AB (ref 850–3900)
MCH: 33.8 pg — ABNORMAL HIGH (ref 27.0–33.0)
MCHC: 35.3 g/dL (ref 32.0–36.0)
MCV: 95.9 fL (ref 80.0–100.0)
MPV: 9.8 fL (ref 7.5–12.5)
Monocytes Relative: 9.1 %
NEUTROS ABS: 2145 {cells}/uL (ref 1500–7800)
Neutrophils Relative %: 24.1 %
PLATELETS: 256 10*3/uL (ref 140–400)
RBC: 4.11 10*6/uL — AB (ref 4.20–5.80)
RDW: 12.6 % (ref 11.0–15.0)
TOTAL LYMPHOCYTE: 64.1 %
WBC: 8.9 10*3/uL (ref 3.8–10.8)
WBCMIX: 810 {cells}/uL (ref 200–950)

## 2018-04-19 LAB — COMPLETE METABOLIC PANEL WITH GFR
AG Ratio: 1.6 (calc) (ref 1.0–2.5)
ALKALINE PHOSPHATASE (APISO): 80 U/L (ref 40–115)
ALT: 15 U/L (ref 9–46)
AST: 17 U/L (ref 10–40)
Albumin: 4.5 g/dL (ref 3.6–5.1)
BUN: 12 mg/dL (ref 7–25)
CO2: 25 mmol/L (ref 20–32)
CREATININE: 0.98 mg/dL (ref 0.60–1.35)
Calcium: 9.6 mg/dL (ref 8.6–10.3)
Chloride: 104 mmol/L (ref 98–110)
GFR, Est African American: 104 mL/min/{1.73_m2} (ref >=60)
GFR, Est Non African American: 90 mL/min/{1.73_m2} (ref >=60)
GLUCOSE: 89 mg/dL (ref 65–99)
Globulin: 2.9 g/dL (calc) (ref 1.9–3.7)
Potassium: 3.3 mmol/L — ABNORMAL LOW (ref 3.5–5.3)
Sodium: 140 mmol/L (ref 135–146)
Total Bilirubin: 0.7 mg/dL (ref 0.2–1.2)
Total Protein: 7.4 g/dL (ref 6.1–8.1)

## 2018-04-19 LAB — URINE CYTOLOGY ANCILLARY ONLY
Chlamydia: NEGATIVE
NEISSERIA GONORRHEA: NEGATIVE

## 2018-04-19 LAB — RPR: RPR: NONREACTIVE

## 2018-04-21 LAB — HIV-1 RNA QUANT-NO REFLEX-BLD
HIV 1 RNA QUANT: NOT DETECTED {copies}/mL
HIV-1 RNA Quant, Log: <1.3 Log copies/mL

## 2018-04-25 ENCOUNTER — Encounter: Payer: Self-pay | Admitting: *Deleted

## 2018-05-02 ENCOUNTER — Ambulatory Visit (INDEPENDENT_AMBULATORY_CARE_PROVIDER_SITE_OTHER): Payer: Self-pay | Admitting: Infectious Disease

## 2018-05-02 ENCOUNTER — Encounter: Payer: Self-pay | Admitting: Infectious Disease

## 2018-05-02 VITALS — BP 128/80 | HR 83 | Temp 98.4°F | Ht 68.0 in | Wt 223.0 lb

## 2018-05-02 DIAGNOSIS — Z113 Encounter for screening for infections with a predominantly sexual mode of transmission: Secondary | ICD-10-CM

## 2018-05-02 DIAGNOSIS — B20 Human immunodeficiency virus [HIV] disease: Secondary | ICD-10-CM

## 2018-05-02 DIAGNOSIS — I10 Essential (primary) hypertension: Secondary | ICD-10-CM

## 2018-05-02 DIAGNOSIS — Z79899 Other long term (current) drug therapy: Secondary | ICD-10-CM
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
## 2018-05-10 ENCOUNTER — Encounter (INDEPENDENT_AMBULATORY_CARE_PROVIDER_SITE_OTHER): Payer: Self-pay | Admitting: *Deleted

## 2018-05-10 VITALS — BP 144/88 | HR 76 | Temp 98.5°F | Wt 220.5 lb

## 2018-05-10 DIAGNOSIS — Z006 Encounter for examination for normal comparison and control in clinical research program: Secondary | ICD-10-CM

## 2018-05-10 LAB — COMPREHENSIVE METABOLIC PANEL
AG RATIO: 1.6 (calc) (ref 1.0–2.5)
ALBUMIN MSPROF: 4.5 g/dL (ref 3.6–5.1)
ALKALINE PHOSPHATASE (APISO): 94 U/L (ref 40–115)
ALT: 25 U/L (ref 9–46)
AST: 21 U/L (ref 10–40)
BILIRUBIN TOTAL: 0.6 mg/dL (ref 0.2–1.2)
BUN: 17 mg/dL (ref 7–25)
CALCIUM: 9.4 mg/dL (ref 8.6–10.3)
CO2: 27 mmol/L (ref 20–32)
Chloride: 101 mmol/L (ref 98–110)
Creat: 1.05 mg/dL (ref 0.60–1.35)
Globulin: 2.9 g/dL (calc) (ref 1.9–3.7)
Glucose, Bld: 99 mg/dL (ref 65–99)
Potassium: 3.4 mmol/L — ABNORMAL LOW (ref 3.5–5.3)
SODIUM: 139 mmol/L (ref 135–146)
Total Protein: 7.4 g/dL (ref 6.1–8.1)

## 2018-05-10 NOTE — Progress Notes (Signed)
[name] is here for his week 264 visit for A5321. He denies any new problems or concerns. He will be returning in [place] for the A5322 study.

## 2018-06-08 ENCOUNTER — Encounter: Payer: Self-pay | Admitting: *Deleted

## 2018-06-08 LAB — HIV-1 RNA QUANT-NO REFLEX-BLD

## 2018-07-03 ENCOUNTER — Other Ambulatory Visit: Payer: Self-pay | Admitting: Internal Medicine

## 2018-07-03 DIAGNOSIS — B20 Human immunodeficiency virus [HIV] disease: Secondary | ICD-10-CM

## 2018-07-12 ENCOUNTER — Encounter (INDEPENDENT_AMBULATORY_CARE_PROVIDER_SITE_OTHER): Payer: Self-pay | Admitting: *Deleted

## 2018-07-12 VITALS — BP 144/88 | HR 74 | Temp 98.6°F | Wt 221.5 lb

## 2018-07-12 DIAGNOSIS — Z006 Encounter for examination for normal comparison and control in clinical research program: Secondary | ICD-10-CM

## 2018-07-12 DIAGNOSIS — Z23 Encounter for immunization: Secondary | ICD-10-CM

## 2018-07-12 LAB — HIV-1 RNA QUANT-NO REFLEX-BLD
CD4 % Helper T Cell: 26.1
CD4: 1070
CD8 % Suppressor T Cell: 39.8
CD8 T CELL ABS: 1632
HIV-1 RNA Viral Load: <40

## 2018-07-12 NOTE — Research (Signed)
[name] is here for his week 288 visit for The [facility]ILO Study: A Long Term follow-up of Older HIV-Infected Adults in the ACTG, an observational study addressing the issues of aging, HIV infection and Inflammation. He denies any new problems or concerns. He was given a flu vaccine today and will be returning in December for the next study visit and sees Dr. Daiva Eves in January.

## 2018-07-13 LAB — COMPREHENSIVE METABOLIC PANEL
AG Ratio: 1.3 (calc) (ref 1.0–2.5)
ALBUMIN MSPROF: 4.4 g/dL (ref 3.6–5.1)
ALKALINE PHOSPHATASE (APISO): 81 U/L (ref 40–115)
ALT: 21 U/L (ref 9–46)
AST: 18 U/L (ref 10–40)
BILIRUBIN TOTAL: 0.5 mg/dL (ref 0.2–1.2)
BUN: 16 mg/dL (ref 7–25)
CHLORIDE: 100 mmol/L (ref 98–110)
CO2: 24 mmol/L (ref 20–32)
CREATININE: 0.91 mg/dL (ref 0.60–1.35)
Calcium: 9.6 mg/dL (ref 8.6–10.3)
GLOBULIN: 3.4 g/dL (ref 1.9–3.7)
Glucose, Bld: 104 mg/dL — ABNORMAL HIGH (ref 65–99)
POTASSIUM: 3.2 mmol/L — AB (ref 3.5–5.3)
SODIUM: 137 mmol/L (ref 135–146)
TOTAL PROTEIN: 7.8 g/dL (ref 6.1–8.1)

## 2018-07-13 LAB — LIPID PANEL
CHOL/HDL RATIO: 3.9 (calc) (ref <5.0)
Cholesterol: 236 mg/dL — ABNORMAL HIGH (ref <200)
HDL: 61 mg/dL (ref >40)
LDL CHOLESTEROL (CALC): 138 mg/dL — AB
NON-HDL CHOLESTEROL (CALC): 175 mg/dL — AB (ref <130)
Triglycerides: 232 mg/dL — ABNORMAL HIGH (ref <150)

## 2018-07-13 LAB — HEPATITIS C ANTIBODY
HEP C AB: NONREACTIVE
SIGNAL TO CUT-OFF: 0.03 (ref <1.00)

## 2018-07-13 LAB — PROTEIN / CREATININE RATIO, URINE
CREATININE, URINE: 217 mg/dL (ref 20–320)
Protein/Creat Ratio: 124 mg/g creat (ref 22–128)
Total Protein, Urine: 27 mg/dL — ABNORMAL HIGH (ref 5–25)

## 2018-07-13 LAB — HEMOGLOBIN A1C
HEMOGLOBIN A1C: 5.6 %{Hb} (ref <5.7)
Mean Plasma Glucose: 114 (calc)
eAG (mmol/L): 6.3 (calc)

## 2018-07-26 ENCOUNTER — Encounter: Payer: Self-pay | Admitting: *Deleted

## 2018-08-05 ENCOUNTER — Other Ambulatory Visit: Payer: Self-pay | Admitting: Infectious Disease

## 2018-10-03 ENCOUNTER — Encounter (INDEPENDENT_AMBULATORY_CARE_PROVIDER_SITE_OTHER): Payer: Self-pay | Admitting: *Deleted

## 2018-10-03 VITALS — BP 128/86 | HR 88 | Temp 98.0°F | Wt 227.5 lb

## 2018-10-03 DIAGNOSIS — Z006 Encounter for examination for normal comparison and control in clinical research program: Secondary | ICD-10-CM

## 2018-10-03 NOTE — Research (Signed)
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 He has been sick the past 10 day with symptoms of nightsweats, feverish, chest congestion, prod. Cough, headache, eyes aching and dizziness. He had some wheezes on exam but no rales. He has been taking alka-seltzer plus for sx. No other new complaints or medications. I will let dr. Daiva EvesVan Dam know to see if he will presribe him anything. His temp. Now is 98.0. he will be returning January 20th to see Dr. Daiva EvesVan Dam and for his [phone] stduy visit.

## 2018-10-04 LAB — COMPREHENSIVE METABOLIC PANEL
AG Ratio: 1.3 (calc) (ref 1.0–2.5)
ALT: 26 U/L (ref 9–46)
AST: 24 U/L (ref 10–40)
Albumin: 4.1 g/dL (ref 3.6–5.1)
Alkaline phosphatase (APISO): 81 U/L (ref 40–115)
BUN: 16 mg/dL (ref 7–25)
CO2: 26 mmol/L (ref 20–32)
Calcium: 9.6 mg/dL (ref 8.6–10.3)
Chloride: 102 mmol/L (ref 98–110)
Creat: 0.93 mg/dL (ref 0.60–1.35)
GLUCOSE: 86 mg/dL (ref 65–99)
Globulin: 3.2 g/dL (calc) (ref 1.9–3.7)
Potassium: 3.7 mmol/L (ref 3.5–5.3)
SODIUM: 139 mmol/L (ref 135–146)
TOTAL PROTEIN: 7.3 g/dL (ref 6.1–8.1)
Total Bilirubin: 0.5 mg/dL (ref 0.2–1.2)

## 2018-10-04 LAB — HEPATITIS C ANTIBODY
HEP C AB: NONREACTIVE
SIGNAL TO CUT-OFF: 0.02 (ref <1.00)

## 2018-10-06 ENCOUNTER — Telehealth: Payer: Self-pay | Admitting: *Deleted

## 2018-10-06 MED ORDER — AZITHROMYCIN 250 MG PO TABS: 250.0000 mg | ORAL_TABLET | Freq: Every day | ORAL | 0 refills | Status: DC

## 2018-10-06 NOTE — Telephone Encounter (Signed)
I called [place] to see if he was feeling any better and he said "not really" and I told him that Dr. Daiva EvesVan Dam had said he could give him a z-pak if he wanted it. He said yes so [facility]

## 2018-10-24 ENCOUNTER — Other Ambulatory Visit: Payer: Self-pay

## 2018-11-07 ENCOUNTER — Ambulatory Visit (INDEPENDENT_AMBULATORY_CARE_PROVIDER_SITE_OTHER): Payer: Self-pay | Admitting: Infectious Disease

## 2018-11-07 ENCOUNTER — Encounter: Payer: Self-pay | Admitting: *Deleted

## 2018-11-07 ENCOUNTER — Encounter: Payer: Self-pay | Admitting: Infectious Disease

## 2018-11-07 VITALS — BP 151/99 | HR 90 | Temp 98.9°F | Wt 226.0 lb

## 2018-11-07 DIAGNOSIS — H538 Other visual disturbances: Secondary | ICD-10-CM

## 2018-11-07 DIAGNOSIS — K625 Hemorrhage of anus and rectum: Secondary | ICD-10-CM

## 2018-11-07 DIAGNOSIS — I10 Essential (primary) hypertension: Secondary | ICD-10-CM

## 2018-11-07 DIAGNOSIS — B20 Human immunodeficiency virus [HIV] disease: Secondary | ICD-10-CM

## 2018-11-07 HISTORY — DX: Hemorrhage of anus and rectum: K62.5

## 2018-11-07 LAB — COMPREHENSIVE METABOLIC PANEL
ALT: 35 U/L (ref 0–44)
ANION GAP: 11 (ref 5–15)
AST: 28 U/L (ref 15–41)
Albumin: 4 g/dL (ref 3.5–5.0)
Alkaline Phosphatase: 104 U/L (ref 38–126)
BUN: 8 mg/dL (ref 6–20)
CO2: 26 mmol/L (ref 22–32)
Calcium: 9.5 mg/dL (ref 8.9–10.3)
Chloride: 100 mmol/L (ref 98–111)
Creatinine, Ser: 1.2 mg/dL (ref 0.61–1.24)
GFR calc Af Amer: >60 mL/min (ref >60)
GFR calc non Af Amer: >60 mL/min (ref >60)
Glucose, Bld: 119 mg/dL — ABNORMAL HIGH (ref 70–99)
Potassium: 3.3 mmol/L — ABNORMAL LOW (ref 3.5–5.1)
Sodium: 137 mmol/L (ref 135–145)
Total Bilirubin: 1.1 mg/dL (ref 0.3–1.2)
Total Protein: 7.1 g/dL (ref 6.5–8.1)

## 2018-11-07 LAB — CBC WITH DIFFERENTIAL/PLATELET
Abs Immature Granulocytes: 0.03 10*3/uL (ref 0.00–0.07)
Basophils Absolute: 0.1 10*3/uL (ref 0.0–0.1)
Basophils Relative: 1 %
Eosinophils Absolute: 0.3 10*3/uL (ref 0.0–0.5)
Eosinophils Relative: 4 %
HCT: 43.2 % (ref 39.0–52.0)
Hemoglobin: 15.2 g/dL (ref 13.0–17.0)
IMMATURE GRANULOCYTES: 0 %
Lymphocytes Relative: 49 %
Lymphs Abs: 3.4 10*3/uL (ref 0.7–4.0)
MCH: 34.5 pg — ABNORMAL HIGH (ref 26.0–34.0)
MCHC: 35.2 g/dL (ref 30.0–36.0)
MCV: 98 fL (ref 80.0–100.0)
Monocytes Absolute: 0.7 10*3/uL (ref 0.1–1.0)
Monocytes Relative: 9 %
NEUTROS ABS: 2.7 10*3/uL (ref 1.7–7.7)
NEUTROS PCT: 37 %
Platelets: 320 10*3/uL (ref 150–400)
RBC: 4.41 MIL/uL (ref 4.22–5.81)
RDW: 11.8 % (ref 11.5–15.5)
WBC: 7.2 10*3/uL (ref 4.0–10.5)
nRBC: 0 % (ref 0.0–0.2)

## 2018-11-07 NOTE — Progress Notes (Signed)
Subjective:    Patient ID: [name], male    DOB: 06-17-1971, 48 y.o.   MRN: [id number]  HPI   [name] comes in today with multiple complaints.  He had a febrile illness roughly 2 weeks ago.  He is also had at times copious amounts of blood per rectum.  This happens when he is straining and sounds like it is due to hemorrhoids.  He is convinced that is coming from his stomach because he says it has a gurgling in his stomach before this happens.  He also states that he has dizziness though he was per tensive on exam and did not have orthostatic vital signs and we checked them.  Review of Systems  Constitutional: Positive for fatigue and fever. Negative for activity change, appetite change, chills, diaphoresis and unexpected weight change.  HENT: Positive for sneezing. Negative for congestion, rhinorrhea, sinus pressure, sore throat and trouble swallowing.   Eyes: Negative for photophobia and visual disturbance.  Respiratory: Negative for cough, chest tightness, shortness of breath, wheezing and stridor.   Cardiovascular: Negative for chest pain, palpitations and leg swelling.  Gastrointestinal: Positive for abdominal distention, anal bleeding and constipation. Negative for abdominal pain, blood in stool, diarrhea, nausea and vomiting.  Genitourinary: Negative for difficulty urinating, dysuria, flank pain and hematuria.  Musculoskeletal: Negative for arthralgias, back pain, gait problem, joint swelling and myalgias.  Skin: Negative for color change, pallor, rash and wound.  Neurological: Positive for dizziness. Negative for tremors, weakness and light-headedness.  Hematological: Negative for adenopathy. Does not bruise/bleed easily.  Psychiatric/Behavioral: Negative for agitation, behavioral problems, confusion, decreased concentration, dysphoric mood and sleep disturbance.       Objective:   Physical Exam Constitutional:      General: He is not in acute distress.    Appearance:  Normal appearance. He is well-developed. He is obese. He is not ill-appearing.  HENT:     Head: Normocephalic and atraumatic.     Nose: Nose normal.     Mouth/Throat:     Mouth: Mucous membranes are moist.  Eyes:     Conjunctiva/sclera: Conjunctivae normal.  Neck:     Musculoskeletal: Normal range of motion and neck supple.  Cardiovascular:     Rate and Rhythm: Normal rate and regular rhythm.     Heart sounds: No murmur. No friction rub. No gallop.   Pulmonary:     Effort: Pulmonary effort is normal. No respiratory distress.     Breath sounds: No wheezing.  Abdominal:     General: Bowel sounds are normal. There is no distension.     Palpations: Abdomen is soft. There is no mass.     Tenderness: There is no abdominal tenderness. There is no guarding.  Genitourinary:   Musculoskeletal: Normal range of motion.        General: No tenderness.  Skin:    General: Skin is warm and dry.     Coloration: Skin is not pale.     Findings: No erythema or rash.  Neurological:     General: No focal deficit present.     Mental Status: He is alert and oriented to person, place, and time. Mental status is at baseline.  Psychiatric:        Attention and Perception: Attention normal.        Mood and Affect: Mood is anxious.        Speech: Speech normal.        Behavior: Behavior normal. Behavior is cooperative.  Thought Content: Thought content normal.        Cognition and Memory: Cognition normal.        Judgment: Judgment normal.           Assessment & Plan:   Blood per rectum: I suspect this is due to internal hemorrhoids that are bleeding we will check a CBC with differential today.  If the hemoglobin does not drop significantly we will be conservative and try to manage this with stool softeners increasing dietary fiber if this not resolve then we will need to refer him for evaluation potentially by GI and/or surgery.  HIV disease continue current  antiretrovirals  Hypertension blood pressure was up rather than being low today will likely add another drug to his regimen due to her.  I spent greater than 25 minutes with the patient including greater than 50% of time in face to face counsel of the patient on the nature of GI bleeds and the work-up that we do for them, reviewing all of his laboratory data and and in coordination of his care.

## 2018-11-07 NOTE — Progress Notes (Signed)
Subjective:  Chief complaint: Blood per rectum and dizziness   Patient ID: [name], male    DOB: [birth year], 48 y.o.   MRN: [id number]  HPI 48 year old Here for follow up of HIV on TRIUMEQ with typically perfect virological suppression.  [name] comes in today with multiple complaints.  He had a febrile illness roughly 2 weeks ago.  He is also had at times copious amounts of blood per rectum.  This happens when he is straining and sounds like it is due to hemorrhoids.  He is convinced that is coming from his stomach because he says it has a gurgling in his stomach before this happens.  He also states that he has dizziness though he was per tensive on exam and did not have orthostatic vital signs and we checked them.    Past Medical History:  Diagnosis Date  . Blurry vision 02/03/2016  . Depression   . Fever, unspecified 07/29/2015  . HIV disease (HCC) 02/24/2015  . HIV infection (HCC)   . Hypertension   . Low testosterone 02/24/2015  . Lower back pain 07/29/2015  . Tinea pedis 07/29/2015    No past surgical history on file.  No family history on file.    Social History   Socioeconomic History  . Marital status: Legally Separated    Spouse name: Not on file  . Number of children: Not on file  . Years of education: Not on file  . Highest education level: Not on file  Occupational History  . Not on file  Social Needs  . Financial resource strain: Not on file  . Food insecurity:    Worry: Not on file    Inability: Not on file  . Transportation needs:    Medical: Not on file    Non-medical: Not on file  Tobacco Use  . Smoking status: Current Every Day Smoker    Packs/day: 0.50    Types: Cigarettes  . Smokeless tobacco: Never Used  . Tobacco comment: pt. not ready to quit smoking at this timeA1/2PPD  Substance and Sexual Activity  . Alcohol use: Yes    Alcohol/week: 3.0 standard drinks    Types: 3 Standard drinks or equivalent per week    Comment: socially  .  Drug use: No  . Sexual activity: Not Currently    Comment: delcined condoms  Lifestyle  . Physical activity:    Days per week: Not on file    Minutes per session: Not on file  . Stress: Not on file  Relationships  . Social connections:    Talks on phone: Not on file    Gets together: Not on file    Attends religious service: Not on file    Active member of club or organization: Not on file    Attends meetings of clubs or organizations: Not on file    Relationship status: Not on file  Other Topics Concern  . Not on file  Social History Narrative  . Not on file    No Known Allergies   Current Outpatient Medications:  .  hydrochlorothiazide (HYDRODIURIL) 25 MG tablet, TAKE 1 TABLET(25 MG) BY MOUTH DAILY, Disp: 30 tablet, Rfl: 3 .  TRIUMEQ 600-50-300 MG tablet, TAKE 1 TABLET BY MOUTH DAILY, Disp: 30 tablet, Rfl: 4 .  azithromycin (ZITHROMAX) 250 MG tablet, Take 1 tablet (250 mg total) by mouth daily. Take 2 tabs the first day then 1 the following 3 days orally. (Patient not taking: Reported on 11/07/2018), Disp:  6 each, Rfl: 0 .  valACYclovir (VALTREX) 1000 MG tablet, Take 1 tablet (1,000 mg total) by mouth daily. (Patient not taking: Reported on 07/12/2018), Disp: 30 tablet, Rfl: 5   Review of Systems  Constitutional: Negative for chills, fatigue and fever.  HENT: Negative for congestion and sore throat.   Eyes: Negative for photophobia.  Respiratory: Negative for cough, shortness of breath and wheezing.   Cardiovascular: Negative for chest pain, palpitations and leg swelling.  Gastrointestinal: Negative for abdominal pain, blood in stool, constipation, diarrhea, nausea and vomiting.  Genitourinary: Negative for dysuria, flank pain and hematuria.  Musculoskeletal: Negative for back pain and myalgias.  Skin: Negative for rash.  Neurological: Negative for dizziness, weakness and headaches.  Hematological: Does not bruise/bleed easily.  Psychiatric/Behavioral: Negative for  suicidal ideas.       Objective:   Physical Exam  Constitutional: He is oriented to person, place, and time. He appears well-developed and well-nourished. No distress.  HENT:  Head: Normocephalic and atraumatic.  Mouth/Throat: No oropharyngeal exudate.  Eyes: Conjunctivae and EOM are normal. No scleral icterus.  Neck: Normal range of motion. Neck supple.  Cardiovascular: Normal rate, regular rhythm and normal heart sounds.  No murmur heard. Pulmonary/Chest: Effort normal. No respiratory distress. He has no wheezes.  Abdominal: Soft. He exhibits no distension.  Musculoskeletal: He exhibits no edema or tenderness.  Neurological: He is alert and oriented to person, place, and time. He exhibits normal muscle tone. Coordination normal.  Skin: Skin is warm and dry. No rash noted. He is not diaphoretic. No erythema. No pallor.  Psychiatric: He has a normal mood and affect. His behavior is normal. Judgment and thought content normal.  Nursing note and vitals reviewed.      Assessment & Plan:   HIV disease: continue  TRIUMEQ. HMAP being renewed RTC in January  HTN: better controlledcbc

## 2018-11-07 NOTE — Addendum Note (Signed)
Addended by: Mariea Clonts D on: 11/07/2018 05:13 PM   Modules accepted: Orders

## 2018-11-08 LAB — T-HELPER CELL (CD4) - (RCID CLINIC ONLY)
CD4 T CELL HELPER: 26 % — AB (ref 33–55)
CD4 T Cell Abs: 890 /uL (ref 400–2700)

## 2018-11-08 LAB — CBC
Absolute CD4: 1167 /uL (ref 359–1519)
CD4%: 30.7 % — ABNORMAL LOW (ref 30.8–58.5)
CD8 % Suppressor T Cell: 37.6 % — ABNORMAL HIGH (ref 12–35.5)
CD8 T Cell Abs: 1429 /uL — AB (ref 109–897)
HCT: 43 % — AB (ref 38–51)
Hemoglobin: 14.5 g/dL (ref 13–17.7)

## 2018-11-08 LAB — HIV RNA, QUANTITATIVE, PCR: HIV 1 RNA QUANT: NOT DETECTED {copies}/mL (ref ?–40)

## 2018-11-09 LAB — RPR: RPR Ser Ql: NONREACTIVE

## 2018-11-09 LAB — HIV-1 RNA QUANT-NO REFLEX-BLD
HIV 1 RNA Quant: <20 copies/mL
HIV-1 RNA Quant, Log: <1.3 Log copies/mL

## 2018-11-26 ENCOUNTER — Other Ambulatory Visit: Payer: Self-pay | Admitting: Infectious Disease

## 2018-11-26 DIAGNOSIS — B20 Human immunodeficiency virus [HIV] disease: Secondary | ICD-10-CM

## 2019-01-06 ENCOUNTER — Other Ambulatory Visit: Payer: Self-pay | Admitting: Infectious Disease

## 2019-01-09 ENCOUNTER — Other Ambulatory Visit: Payer: Self-pay

## 2019-01-09 ENCOUNTER — Ambulatory Visit (INDEPENDENT_AMBULATORY_CARE_PROVIDER_SITE_OTHER): Payer: Self-pay | Admitting: Infectious Disease

## 2019-01-09 ENCOUNTER — Encounter: Payer: Self-pay | Admitting: Infectious Disease

## 2019-01-09 VITALS — BP 138/85 | HR 84 | Temp 98.0°F | Wt 228.0 lb

## 2019-01-09 DIAGNOSIS — Z79899 Other long term (current) drug therapy: Secondary | ICD-10-CM

## 2019-01-09 DIAGNOSIS — I10 Essential (primary) hypertension: Secondary | ICD-10-CM

## 2019-01-09 DIAGNOSIS — F17200 Nicotine dependence, unspecified, uncomplicated: Secondary | ICD-10-CM

## 2019-01-09 DIAGNOSIS — B20 Human immunodeficiency virus [HIV] disease: Secondary | ICD-10-CM

## 2019-01-09 DIAGNOSIS — Z113 Encounter for screening for infections with a predominantly sexual mode of transmission: Secondary | ICD-10-CM

## 2019-01-09 NOTE — Progress Notes (Signed)
Subjective:   Chief complaint: He is here for follow-up for his HIV disease   Patient ID: [name], male    DOB: Dec 20, 1970, 48 y.o.   MRN: [id number]  HPI  [name] is here for his HIV disease.  His virus remains nicely suppressed though he does need to make sure he is renewed his HIV medication assistance program.    He took his blood pressure medicine 30 minutes prior to arrival.  Past Medical History:  Diagnosis Date  . Blood per rectum 11/07/2018  . Blurry vision 02/03/2016  . Depression   . Fever, unspecified 07/29/2015  . HIV disease (HCC) 02/24/2015  . HIV infection (HCC)   . Hypertension   . Low testosterone 02/24/2015  . Lower back pain 07/29/2015  . Tinea pedis 07/29/2015    No past surgical history on file.  No family history on file.    Social History   Socioeconomic History  . Marital status: Legally Separated    Spouse name: Not on file  . Number of children: Not on file  . Years of education: Not on file  . Highest education level: Not on file  Occupational History  . Not on file  Social Needs  . Financial resource strain: Not on file  . Food insecurity:    Worry: Not on file    Inability: Not on file  . Transportation needs:    Medical: Not on file    Non-medical: Not on file  Tobacco Use  . Smoking status: Current Every Day Smoker    Packs/day: 0.50    Types: Cigarettes  . Smokeless tobacco: Never Used  . Tobacco comment: pt. not ready to quit smoking at this timeA1/2PPD  Substance and Sexual Activity  . Alcohol use: Yes    Alcohol/week: 3.0 standard drinks    Types: 3 Standard drinks or equivalent per week    Comment: socially  . Drug use: No  . Sexual activity: Not Currently    Comment: delcined condoms  Lifestyle  . Physical activity:    Days per week: Not on file    Minutes per session: Not on file  . Stress: Not on file  Relationships  . Social connections:    Talks on phone: Not on file    Gets together: Not on file   Attends religious service: Not on file    Active member of club or organization: Not on file    Attends meetings of clubs or organizations: Not on file    Relationship status: Not on file  Other Topics Concern  . Not on file  Social History Narrative  . Not on file    No Known Allergies   Current Outpatient Medications:  .  hydrochlorothiazide (HYDRODIURIL) 25 MG tablet, TAKE 1 TABLET(25 MG) BY MOUTH DAILY, Disp: 30 tablet, Rfl: 0 .  TRIUMEQ 600-50-300 MG tablet, TAKE 1 TABLET BY MOUTH DAILY, Disp: 30 tablet, Rfl: 4 .  azithromycin (ZITHROMAX) 250 MG tablet, Take 1 tablet (250 mg total) by mouth daily. Take 2 tabs the first day then 1 the following 3 days orally. (Patient not taking: Reported on 11/07/2018), Disp: 6 each, Rfl: 0 .  valACYclovir (VALTREX) 1000 MG tablet, Take 1 tablet (1,000 mg total) by mouth daily. (Patient not taking: Reported on 07/12/2018), Disp: 30 tablet, Rfl: 5  Review of Systems  Constitutional: Positive for fatigue and fever. Negative for activity change, appetite change, chills, diaphoresis and unexpected weight change.  HENT: Positive for sneezing. Negative  for congestion, rhinorrhea, sinus pressure, sore throat and trouble swallowing.   Eyes: Negative for photophobia and visual disturbance.  Respiratory: Negative for cough, chest tightness, shortness of breath, wheezing and stridor.   Cardiovascular: Negative for chest pain, palpitations and leg swelling.  Gastrointestinal: Positive for abdominal distention, anal bleeding and constipation. Negative for abdominal pain, blood in stool, diarrhea, nausea and vomiting.  Genitourinary: Negative for difficulty urinating, dysuria, flank pain and hematuria.  Musculoskeletal: Negative for arthralgias, back pain, gait problem, joint swelling and myalgias.  Skin: Negative for color change, pallor, rash and wound.  Neurological: Positive for dizziness. Negative for tremors, weakness and light-headedness.  Hematological:  Negative for adenopathy. Does not bruise/bleed easily.  Psychiatric/Behavioral: Negative for agitation, behavioral problems, confusion, decreased concentration, dysphoric mood and sleep disturbance.       Objective:   Physical Exam Constitutional:      General: He is not in acute distress.    Appearance: Normal appearance. He is well-developed. He is obese. He is not ill-appearing.  HENT:     Head: Normocephalic and atraumatic.     Nose: Nose normal.     Mouth/Throat:     Mouth: Mucous membranes are moist.  Eyes:     Conjunctiva/sclera: Conjunctivae normal.  Neck:     Musculoskeletal: Normal range of motion and neck supple.  Cardiovascular:     Rate and Rhythm: Normal rate and regular rhythm.  Pulmonary:     Effort: Pulmonary effort is normal. No respiratory distress.     Breath sounds: No wheezing.  Abdominal:     General: There is no distension.     Tenderness: There is no guarding.  Musculoskeletal: Normal range of motion.        General: No tenderness.  Skin:    General: Skin is warm and dry.     Coloration: Skin is not pale.     Findings: No erythema or rash.  Neurological:     General: No focal deficit present.     Mental Status: He is alert and oriented to person, place, and time. Mental status is at baseline.  Psychiatric:        Attention and Perception: Attention normal.        Mood and Affect: Mood is anxious.        Speech: Speech normal.        Behavior: Behavior normal. Behavior is cooperative.        Thought Content: Thought content normal.        Cognition and Memory: Cognition normal.        Judgment: Judgment normal.           Assessment & Plan:   HIV disease continue current antiretrovirals  Hypertension blood pressure continue hydrochlorothiazide.  I have asked him to purchase a blood pressure cuff and monitor blood pressure at home.  Smoking I have asked him to try his best to stop smoking, particular in light of the novel coronavirus 2019  pandemic that is unfolding.  . I spent greater than 25 minutes with the patient including greater than 50% of time in face to face counsel of the patient the need to continue take his anti-hypertensives along with his antivirals religiously, to stop smoking and to optimize social distancing to avoid contracting novel coronavirus 2019 and reduce risks of morbidity and mortality associated with this virus and in coordination of his care.

## 2019-01-24 ENCOUNTER — Other Ambulatory Visit: Payer: Self-pay | Admitting: Infectious Disease

## 2019-04-14 ENCOUNTER — Other Ambulatory Visit: Payer: Self-pay | Admitting: Infectious Disease

## 2019-04-14 DIAGNOSIS — B20 Human immunodeficiency virus [HIV] disease: Secondary | ICD-10-CM

## 2019-05-21 ENCOUNTER — Other Ambulatory Visit: Payer: Self-pay

## 2019-05-21 DIAGNOSIS — Z113 Encounter for screening for infections with a predominantly sexual mode of transmission: Secondary | ICD-10-CM

## 2019-05-21 DIAGNOSIS — B20 Human immunodeficiency virus [HIV] disease: Secondary | ICD-10-CM

## 2019-05-21 DIAGNOSIS — Z79899 Other long term (current) drug therapy: Secondary | ICD-10-CM

## 2019-05-21 DIAGNOSIS — I10 Essential (primary) hypertension: Secondary | ICD-10-CM

## 2019-05-22 ENCOUNTER — Encounter: Payer: Self-pay | Admitting: Infectious Disease

## 2019-05-22 LAB — T-HELPER CELL (CD4) - (RCID CLINIC ONLY)
CD4 % Helper T Cell: 27 % — ABNORMAL LOW (ref 33–65)
CD4 T Cell Abs: 858 /uL (ref 400–1790)

## 2019-05-27 LAB — RPR: RPR Ser Ql: NONREACTIVE

## 2019-05-27 LAB — CBC WITH DIFFERENTIAL/PLATELET
Absolute Monocytes: 500 cells/uL (ref 200–950)
Basophils Absolute: 43 cells/uL (ref 0–200)
Basophils Relative: 0.7 %
Eosinophils Absolute: 183 cells/uL (ref 15–500)
Eosinophils Relative: 3 %
HCT: 41.9 % (ref 38.5–50.0)
Hemoglobin: 15.2 g/dL (ref 13.2–17.1)
Lymphs Abs: 3678 cells/uL (ref 850–3900)
MCH: 34.9 pg — ABNORMAL HIGH (ref 27.0–33.0)
MCHC: 36.3 g/dL — ABNORMAL HIGH (ref 32.0–36.0)
MCV: 96.3 fL (ref 80.0–100.0)
MPV: 9.8 fL (ref 7.5–12.5)
Monocytes Relative: 8.2 %
Neutro Abs: 1696 cells/uL (ref 1500–7800)
Neutrophils Relative %: 27.8 %
Platelets: 248 10*3/uL (ref 140–400)
RBC: 4.35 10*6/uL (ref 4.20–5.80)
RDW: 12.3 % (ref 11.0–15.0)
Total Lymphocyte: 60.3 %
WBC: 6.1 10*3/uL (ref 3.8–10.8)

## 2019-05-27 LAB — COMPLETE METABOLIC PANEL WITH GFR
AG Ratio: 1.3 (calc) (ref 1.0–2.5)
ALT: 17 U/L (ref 9–46)
AST: 18 U/L (ref 10–35)
Albumin: 4.4 g/dL (ref 3.6–5.1)
Alkaline phosphatase (APISO): 74 U/L (ref 35–144)
BUN: 11 mg/dL (ref 7–25)
CO2: 25 mmol/L (ref 20–32)
Calcium: 9.7 mg/dL (ref 8.6–10.3)
Chloride: 101 mmol/L (ref 98–110)
Creat: 0.9 mg/dL (ref 0.70–1.33)
GFR, Est African American: 115 mL/min/{1.73_m2} (ref >=60)
GFR, Est Non African American: 99 mL/min/{1.73_m2} (ref >=60)
Globulin: 3.4 g/dL (calc) (ref 1.9–3.7)
Glucose, Bld: 97 mg/dL (ref 65–99)
Potassium: 3.6 mmol/L (ref 3.5–5.3)
Sodium: 138 mmol/L (ref 135–146)
Total Bilirubin: 0.8 mg/dL (ref 0.2–1.2)
Total Protein: 7.8 g/dL (ref 6.1–8.1)

## 2019-05-27 LAB — LIPID PANEL
Cholesterol: 224 mg/dL — ABNORMAL HIGH (ref <200)
HDL: 59 mg/dL (ref >=40)
LDL Cholesterol (Calc): 134 mg/dL (calc) — ABNORMAL HIGH
Non-HDL Cholesterol (Calc): 165 mg/dL (calc) — ABNORMAL HIGH (ref <130)
Total CHOL/HDL Ratio: 3.8 (calc) (ref <5.0)
Triglycerides: 174 mg/dL — ABNORMAL HIGH (ref <150)

## 2019-05-27 LAB — HIV-1 RNA QUANT-NO REFLEX-BLD
HIV 1 RNA Quant: 22 copies/mL — ABNORMAL HIGH
HIV-1 RNA Quant, Log: 1.34 Log copies/mL — ABNORMAL HIGH

## 2019-06-04 ENCOUNTER — Ambulatory Visit (INDEPENDENT_AMBULATORY_CARE_PROVIDER_SITE_OTHER): Payer: Self-pay | Admitting: Infectious Disease

## 2019-06-04 ENCOUNTER — Encounter: Payer: Self-pay | Admitting: Infectious Disease

## 2019-06-04 ENCOUNTER — Other Ambulatory Visit: Payer: Self-pay

## 2019-06-04 VITALS — BP 156/98 | HR 73 | Temp 98.0°F

## 2019-06-04 DIAGNOSIS — B20 Human immunodeficiency virus [HIV] disease: Secondary | ICD-10-CM

## 2019-06-04 DIAGNOSIS — I10 Essential (primary) hypertension: Secondary | ICD-10-CM

## 2019-06-04 MED ORDER — HYDROCHLOROTHIAZIDE 25 MG PO TABS: 25.0000 mg | ORAL_TABLET | Freq: Every day | ORAL | 11 refills | Status: DC

## 2019-06-04 NOTE — Progress Notes (Signed)
Subjective:   Chief complaint: He is here for follow-up for his HIV disease   Patient ID: [name], male    DOB: 01/06/1971, 48 y.o.   MRN: [id number]  HPI [name] is here for his HIV disease.  His virus remains nicely suppressed though he does need to make sure he is renewed his HIV medication assistance program.    He is out of his anti HTNSive medication.  Past Medical History:  Diagnosis Date  . Blood per rectum 11/07/2018  . Blurry vision 02/03/2016  . Depression   . Fever, unspecified 07/29/2015  . HIV disease (HCC) 02/24/2015  . HIV infection (HCC)   . Hypertension   . Low testosterone 02/24/2015  . Lower back pain 07/29/2015  . Tinea pedis 07/29/2015    No past surgical history on file.  No family history on file.    Social History   Socioeconomic History  . Marital status: Legally Separated    Spouse name: Not on file  . Number of children: Not on file  . Years of education: Not on file  . Highest education level: Not on file  Occupational History  . Not on file  Social Needs  . Financial resource strain: Not on file  . Food insecurity    Worry: Not on file    Inability: Not on file  . Transportation needs    Medical: Not on file    Non-medical: Not on file  Tobacco Use  . Smoking status: Current Every Day Smoker    Packs/day: 0.50    Types: Cigarettes  . Smokeless tobacco: Never Used  . Tobacco comment: pt. not ready to quit smoking at this timeA1/2PPD  Substance and Sexual Activity  . Alcohol use: Yes    Alcohol/week: 3.0 standard drinks    Types: 3 Standard drinks or equivalent per week    Comment: socially  . Drug use: No  . Sexual activity: Not Currently    Comment: delcined condoms  Lifestyle  . Physical activity    Days per week: Not on file    Minutes per session: Not on file  . Stress: Not on file  Relationships  . Social Musicianconnections    Talks on phone: Not on file    Gets together: Not on file    Attends religious service: Not on  file    Active member of club or organization: Not on file    Attends meetings of clubs or organizations: Not on file    Relationship status: Not on file  Other Topics Concern  . Not on file  Social History Narrative  . Not on file    No Known Allergies   Current Outpatient Medications:  .  azithromycin (ZITHROMAX) 250 MG tablet, Take 1 tablet (250 mg total) by mouth daily. Take 2 tabs the first day then 1 the following 3 days orally. (Patient not taking: Reported on 11/07/2018), Disp: 6 each, Rfl: 0 .  hydrochlorothiazide (HYDRODIURIL) 25 MG tablet, TAKE 1 TABLET BY MOUTH DAILY, Disp: 30 tablet, Rfl: 3 .  TRIUMEQ 600-50-300 MG tablet, TAKE 1 TABLET BY MOUTH DAILY, Disp: 30 tablet, Rfl: 5 .  valACYclovir (VALTREX) 1000 MG tablet, Take 1 tablet (1,000 mg total) by mouth daily. (Patient not taking: Reported on 07/12/2018), Disp: 30 tablet, Rfl: 5  Review of Systems  Constitutional: Negative for activity change, appetite change, chills, diaphoresis, fever and unexpected weight change.  HENT: Negative for congestion, rhinorrhea, sinus pressure, sneezing, sore throat and trouble  swallowing.   Eyes: Negative for photophobia and visual disturbance.  Respiratory: Negative for cough, chest tightness, shortness of breath, wheezing and stridor.   Cardiovascular: Negative for chest pain, palpitations and leg swelling.  Gastrointestinal: Negative for abdominal pain, anal bleeding, blood in stool, diarrhea, nausea and vomiting.  Genitourinary: Negative for difficulty urinating, dysuria, flank pain and hematuria.  Musculoskeletal: Negative for arthralgias, back pain, gait problem, joint swelling and myalgias.  Skin: Negative for color change, pallor, rash and wound.  Neurological: Negative for dizziness, tremors, weakness and light-headedness.  Hematological: Negative for adenopathy. Does not bruise/bleed easily.  Psychiatric/Behavioral: Negative for agitation, behavioral problems, confusion, decreased  concentration, dysphoric mood and sleep disturbance.       Objective:   Physical Exam Constitutional:      General: He is not in acute distress.    Appearance: Normal appearance. He is well-developed. He is obese. He is not ill-appearing.  HENT:     Head: Normocephalic and atraumatic.     Nose: Nose normal.     Mouth/Throat:     Mouth: Mucous membranes are moist.  Eyes:     Conjunctiva/sclera: Conjunctivae normal.  Neck:     Musculoskeletal: Normal range of motion and neck supple.  Cardiovascular:     Rate and Rhythm: Normal rate and regular rhythm.  Pulmonary:     Effort: Pulmonary effort is normal. No respiratory distress.     Breath sounds: No wheezing.  Abdominal:     General: There is no distension.     Tenderness: There is no guarding.  Musculoskeletal: Normal range of motion.        General: No tenderness.  Skin:    General: Skin is warm and dry.     Coloration: Skin is not pale.     Findings: No erythema or rash.  Neurological:     General: No focal deficit present.     Mental Status: He is alert and oriented to person, place, and time. Mental status is at baseline.  Psychiatric:        Attention and Perception: Attention normal.        Mood and Affect: Mood normal. Mood is not anxious.        Speech: Speech normal.        Behavior: Behavior normal. Behavior is cooperative.        Thought Content: Thought content normal.        Cognition and Memory: Cognition normal.        Judgment: Judgment normal.           Assessment & Plan:   HIV disease continue [place]   Hypertension blood pressure renew  hydrochlorothiazide.  I have asked him to purchase a blood pressure cuff and monitor blood pressure at home.  Smoking I have asked him to try his best to stop smoking, particular in light of the novel coronavirus 2019 pandemic that is unfolding.  . I spent greater than 25 minutes with the patient including greater than 50% of time in face to face counsel of  the patient the need to continue take his anti-hypertensives along with his antivirals religiously, to stop smoking and to optimize social distancing to avoid contracting novel coronavirus 2019 and reduce risks of morbidity and mortality associated with this virus and in coordination of his care.

## 2019-10-09 ENCOUNTER — Other Ambulatory Visit: Payer: Self-pay

## 2019-10-09 ENCOUNTER — Other Ambulatory Visit: Payer: Self-pay | Admitting: Infectious Disease

## 2019-10-09 ENCOUNTER — Encounter (INDEPENDENT_AMBULATORY_CARE_PROVIDER_SITE_OTHER): Payer: Self-pay | Admitting: *Deleted

## 2019-10-09 VITALS — BP 150/87 | HR 80 | Temp 98.3°F | Wt 230.4 lb

## 2019-10-09 DIAGNOSIS — H538 Other visual disturbances: Secondary | ICD-10-CM

## 2019-10-09 DIAGNOSIS — H5789 Other specified disorders of eye and adnexa: Secondary | ICD-10-CM

## 2019-10-09 DIAGNOSIS — Z006 Encounter for examination for normal comparison and control in clinical research program: Secondary | ICD-10-CM

## 2019-10-09 LAB — HIV-1 RNA QUANT-NO REFLEX-BLD
CD4 Count: 924
CD4%: 28
CD8 % Suppressor T Cell: 40.8
CD8 T Cell Abs: 1346
HIV 1 RNA UltraQuant: <40

## 2019-10-09 NOTE — Research (Signed)
[name] was here for both The HAILO Study: A Long Term follow-up of Older HIV-Infected Adults in the ACTG, an observational study addressing the issues of aging, HIV infection and Inflammation and AHRC Study: Decay of HIV-1 Reservoirs in Subjects on long-term ARVs, an observational study. He is complaining of his eyes bothering him. He says his vision is blurred and he seems to have more trouble with near vision than far away. He feels like he has an infection in his eyes and says they swell at times and tear up. He did have some type of viral infection in his eyes back in 2007 and saw an ophthalmologist then. He wants to see them again.   Other than that his assessment is mostly unchanged. His breasts are still tender and slightly swollen. He will see Dr. Tommy Medal in February. I canceled his lab visit in January since we just drew labs today.  He is refusing his flushot, since he got sick with the last one.

## 2019-10-09 NOTE — Progress Notes (Signed)
[name] I did an order for ambulatory referral to an ophthalmology

## 2019-10-10 LAB — HEPATITIS C ANTIBODY
Hepatitis C Ab: NONREACTIVE
SIGNAL TO CUT-OFF: 0.03 (ref <1.00)

## 2019-10-10 LAB — HEMOGLOBIN A1C
Hgb A1c MFr Bld: 5.6 % of total Hgb (ref <5.7)
Mean Plasma Glucose: 114 (calc)
eAG (mmol/L): 6.3 (calc)

## 2019-10-10 LAB — COMPREHENSIVE METABOLIC PANEL
AG Ratio: 1.4 (calc) (ref 1.0–2.5)
ALT: 27 U/L (ref 9–46)
AST: 23 U/L (ref 10–35)
Albumin: 4.4 g/dL (ref 3.6–5.1)
Alkaline phosphatase (APISO): 84 U/L (ref 35–144)
BUN: 16 mg/dL (ref 7–25)
CO2: 21 mmol/L (ref 20–32)
Calcium: 9.3 mg/dL (ref 8.6–10.3)
Chloride: 102 mmol/L (ref 98–110)
Creat: 0.93 mg/dL (ref 0.70–1.33)
Globulin: 3.1 g/dL (calc) (ref 1.9–3.7)
Glucose, Bld: 100 mg/dL — ABNORMAL HIGH (ref 65–99)
Potassium: 3.6 mmol/L (ref 3.5–5.3)
Sodium: 140 mmol/L (ref 135–146)
Total Bilirubin: 0.4 mg/dL (ref 0.2–1.2)
Total Protein: 7.5 g/dL (ref 6.1–8.1)

## 2019-10-10 LAB — CREATININE, URINE, RANDOM: Creatinine, Urine: 254 mg/dL (ref 20–320)

## 2019-10-10 LAB — LIPID PANEL
Cholesterol: 231 mg/dL — ABNORMAL HIGH (ref <200)
HDL: 58 mg/dL (ref >=40)
LDL Cholesterol (Calc): 135 mg/dL (calc) — ABNORMAL HIGH
Non-HDL Cholesterol (Calc): 173 mg/dL (calc) — ABNORMAL HIGH (ref <130)
Total CHOL/HDL Ratio: 4 (calc) (ref <5.0)
Triglycerides: 233 mg/dL — ABNORMAL HIGH (ref <150)

## 2019-10-10 LAB — PROTEIN, URINE, RANDOM: Total Protein, Urine: 18 mg/dL (ref 5–25)

## 2019-11-02 ENCOUNTER — Encounter: Payer: Self-pay | Admitting: Infectious Disease

## 2019-11-05 ENCOUNTER — Other Ambulatory Visit: Payer: Self-pay

## 2019-12-03 ENCOUNTER — Encounter: Payer: Self-pay | Admitting: Infectious Disease

## 2019-12-12 ENCOUNTER — Encounter: Payer: Self-pay | Admitting: Infectious Disease

## 2020-01-16 ENCOUNTER — Other Ambulatory Visit: Payer: Self-pay | Admitting: Infectious Disease

## 2020-01-16 DIAGNOSIS — B20 Human immunodeficiency virus [HIV] disease: Secondary | ICD-10-CM

## 2020-01-30 ENCOUNTER — Encounter: Payer: Self-pay | Admitting: Infectious Disease

## 2020-01-30 ENCOUNTER — Ambulatory Visit (INDEPENDENT_AMBULATORY_CARE_PROVIDER_SITE_OTHER): Payer: Self-pay | Admitting: Infectious Disease

## 2020-01-30 ENCOUNTER — Other Ambulatory Visit: Payer: Self-pay

## 2020-01-30 VITALS — BP 124/82 | HR 70 | Temp 98.3°F | Ht 68.0 in | Wt 226.0 lb

## 2020-01-30 DIAGNOSIS — F17200 Nicotine dependence, unspecified, uncomplicated: Secondary | ICD-10-CM | POA: Insufficient documentation

## 2020-01-30 DIAGNOSIS — I10 Essential (primary) hypertension: Secondary | ICD-10-CM

## 2020-01-30 DIAGNOSIS — B20 Human immunodeficiency virus [HIV] disease: Secondary | ICD-10-CM

## 2020-01-30 HISTORY — DX: Nicotine dependence, unspecified, uncomplicated: F17.200

## 2020-01-30 MED ORDER — BIKTARVY 50-200-25 MG PO TABS: 1.0000 | ORAL_TABLET | Freq: Every day | ORAL | 11 refills | Status: DC

## 2020-01-30 NOTE — Progress Notes (Signed)
Subjective:   Chief complaint: He is here for follow-up for his HIV disease   Patient ID: [name], male    DOB: 02/20/1971, 49 y.o.   MRN: [id number]  HPI [name] is here follow-up for his HIV disease, hypertension and smoking.  He remains on Triumeq and has been suppressed.  I would like to change him over to [facility] to get the a abacavir out of his regimen.    Past Medical History:  Diagnosis Date  . Blood per rectum 11/07/2018  . Blurry vision 02/03/2016  . Depression   . Fever, unspecified 07/29/2015  . HIV disease ([place]) 02/24/2015  . HIV infection ([place])   . Hypertension   . Low testosterone 02/24/2015  . Lower back pain 07/29/2015  . Tinea pedis 07/29/2015    No past surgical history on file.  No family history on file.    Social History   Socioeconomic History  . Marital status: Legally Separated    Spouse name: Not on file  . Number of children: Not on file  . Years of education: Not on file  . Highest education level: Not on file  Occupational History  . Not on file  Tobacco Use  . Smoking status: Current Every Day Smoker    Packs/day: 0.50    Types: Cigarettes  . Smokeless tobacco: Never Used  . Tobacco comment: pt. not ready to quit smoking at this timeA1/2PPD  Substance and Sexual Activity  . Alcohol use: Yes    Alcohol/week: 3.0 standard drinks    Types: 3 Standard drinks or equivalent per week    Comment: socially  . Drug use: No  . Sexual activity: Not Currently    Comment: delcined condoms  Other Topics Concern  . Not on file  Social History Narrative  . Not on file   Social Determinants of Health   Financial Resource Strain:   . Difficulty of Paying Living Expenses:   Food Insecurity:   . Worried About Charity fundraiser in the Last Year:   . Arboriculturist in the Last Year:   Transportation Needs:   . Film/video editor (Medical):   Marland Kitchen Lack of Transportation (Non-Medical):   Physical Activity:   . Days of Exercise per  Week:   . Minutes of Exercise per Session:   Stress:   . Feeling of Stress :   Social Connections:   . Frequency of Communication with Friends and Family:   . Frequency of Social Gatherings with Friends and Family:   . Attends Religious Services:   . Active Member of Clubs or Organizations:   . Attends Archivist Meetings:   Marland Kitchen Marital Status:     No Known Allergies   Current Outpatient Medications:  .  hydrochlorothiazide (HYDRODIURIL) 25 MG tablet, Take 1 tablet (25 mg total) by mouth daily., Disp: 30 tablet, Rfl: 11 .  TRIUMEQ 600-50-300 MG tablet, TAKE 1 TABLET BY MOUTH DAILY, Disp: 30 tablet, Rfl: 0 .  valACYclovir (VALTREX) 1000 MG tablet, Take 1 tablet (1,000 mg total) by mouth daily. (Patient not taking: Reported on 07/12/2018), Disp: 30 tablet, Rfl: 5  Review of Systems  Constitutional: Negative for activity change, appetite change, chills, diaphoresis, fever and unexpected weight change.  HENT: Negative for congestion, rhinorrhea, sinus pressure, sneezing, sore throat and trouble swallowing.   Eyes: Negative for photophobia and visual disturbance.  Respiratory: Negative for cough, chest tightness, shortness of breath, wheezing and stridor.   Cardiovascular: Negative  for chest pain, palpitations and leg swelling.  Gastrointestinal: Negative for abdominal pain, anal bleeding, blood in stool, diarrhea, nausea and vomiting.  Genitourinary: Negative for difficulty urinating, dysuria, flank pain and hematuria.  Musculoskeletal: Negative for arthralgias, back pain, gait problem, joint swelling and myalgias.  Skin: Negative for color change, pallor, rash and wound.  Neurological: Negative for dizziness, tremors, weakness and light-headedness.  Hematological: Negative for adenopathy. Does not bruise/bleed easily.  Psychiatric/Behavioral: Negative for agitation, behavioral problems, confusion, decreased concentration, dysphoric mood, self-injury and sleep disturbance.        Objective:   Physical Exam Constitutional:      General: He is not in acute distress.    Appearance: Normal appearance. He is well-developed. He is obese. He is not ill-appearing.  HENT:     Head: Normocephalic and atraumatic.     Nose: Nose normal.     Mouth/Throat:     Mouth: Mucous membranes are moist.  Eyes:     Conjunctiva/sclera: Conjunctivae normal.  Cardiovascular:     Rate and Rhythm: Normal rate and regular rhythm.  Pulmonary:     Effort: Pulmonary effort is normal. No respiratory distress.     Breath sounds: No wheezing.  Abdominal:     General: There is no distension.  Musculoskeletal:        General: No tenderness. Normal range of motion.     Cervical back: Normal range of motion and neck supple.  Skin:    General: Skin is warm and dry.     Coloration: Skin is not pale.     Findings: No erythema or rash.  Neurological:     General: No focal deficit present.     Mental Status: He is alert and oriented to person, place, and time. Mental status is at baseline.  Psychiatric:        Attention and Perception: Attention normal.        Mood and Affect: Mood normal. Mood is not anxious.        Speech: Speech normal.        Behavior: Behavior normal. Behavior is cooperative.        Thought Content: Thought content normal.        Cognition and Memory: Cognition normal.        Judgment: Judgment normal.           Assessment & Plan:  HIV disease: Switch to Biktarvy and recheck labs in roughly 2 months he will use the remaining 40 Trymex and then switch over.  Hypertension: Has never been optimal in clinic would like him to get a home blood pressure cuff if possible.  Smoking a really love that he could stop smoking has been a difficult battle for him.  Obesity: could try carbohydrate restriction in the future  Covid vaccination I have emphasized the very much needs to get Covid vaccination and given him instructions on how to obtain vaccine HIV disease continue  TRIUMEQ   Hypertension blood pressure renew  hydrochlorothiazide.   .  Smoking

## 2020-01-31 ENCOUNTER — Encounter: Payer: Self-pay | Admitting: Infectious Disease

## 2020-01-31 ENCOUNTER — Ambulatory Visit: Payer: Self-pay | Attending: Internal Medicine

## 2020-01-31 DIAGNOSIS — Z23 Encounter for immunization: Secondary | ICD-10-CM

## 2020-01-31 NOTE — Progress Notes (Signed)
   Covid-19 Vaccination Clinic  Name:  [name]    MRN: [id number] DOB: 01-15-1971  01/31/2020  [name] was observed post Covid-19 immunization for 15 minutes without incident. He was provided with Vaccine Information Sheet and instruction to access the V-Safe system.   [name] was instructed to call 911 with any severe reactions post vaccine: Marland Kitchen Difficulty breathing  . Swelling of face and throat  . A fast heartbeat  . A bad rash all over body  . Dizziness and weakness   Immunizations Administered    Name Date Dose VIS Date Route   Pfizer COVID-19 Vaccine 01/31/2020 12:12 PM 0.3 mL 09/28/2019 Intramuscular   Manufacturer: [employer], [employer]   Lot: [id number]   NDC: [address]-4

## 2020-02-11 ENCOUNTER — Other Ambulatory Visit: Payer: Self-pay | Admitting: Infectious Disease

## 2020-02-11 DIAGNOSIS — B20 Human immunodeficiency virus [HIV] disease: Secondary | ICD-10-CM

## 2020-02-25 ENCOUNTER — Ambulatory Visit: Payer: Self-pay | Attending: Internal Medicine

## 2020-02-25 DIAGNOSIS — Z23 Encounter for immunization: Secondary | ICD-10-CM

## 2020-02-25 NOTE — Progress Notes (Signed)
   Covid-19 Vaccination Clinic  Name:  Kadrian Partch    MRN: [id number] DOB: October 20, 1970  02/25/2020  Mr. Mcgillis was observed post Covid-19 immunization for 15 minutes without incident. He was provided with Vaccine Information Sheet and instruction to access the V-Safe system.   Mr. Rodeheaver was instructed to call 911 with any severe reactions post vaccine: Marland Kitchen Difficulty breathing  . Swelling of face and throat  . A fast heartbeat  . A bad rash all over body  . Dizziness and weakness   Immunizations Administered    Name Date Dose VIS Date Route   Pfizer COVID-19 Vaccine 02/25/2020  1:25 PM 0.3 mL 12/12/2018 Intramuscular   Manufacturer: [employer], [employer]   Lot: [id number]   NDC: [address]-4

## 2020-04-30 ENCOUNTER — Ambulatory Visit: Payer: Self-pay | Admitting: Infectious Disease

## 2020-05-14 ENCOUNTER — Other Ambulatory Visit: Payer: Self-pay

## 2020-05-14 ENCOUNTER — Ambulatory Visit: Payer: Self-pay

## 2020-05-15 ENCOUNTER — Other Ambulatory Visit: Payer: Self-pay

## 2020-05-15 ENCOUNTER — Encounter: Payer: Self-pay | Admitting: *Deleted

## 2020-05-15 ENCOUNTER — Encounter: Payer: Self-pay | Admitting: Infectious Disease

## 2020-05-15 VITALS — BP 142/85 | HR 64 | Temp 98.1°F | Ht 67.5 in | Wt 222.5 lb

## 2020-05-15 DIAGNOSIS — Z006 Encounter for examination for normal comparison and control in clinical research program: Secondary | ICD-10-CM

## 2020-05-15 NOTE — Research (Signed)
[name] was here for his week 384 visit for The HAILO Study: A Long Term follow-up of Older HIV-Infected Adults in the ACTG, an observational study addressing the issues of aging, HIV infection and Inflammation.  He denies any new problems or concerns. His vision is still blurred, the clinic has tried to get him setup with an eye doctor but he did not keep the appt. In the past. He say he is going to go back to [place] for about 6 months in November. He reports feeling very alone, has no one here he can depend on or that he can trust, denies needing any counseling at this time. This was his final visit for study.

## 2020-05-16 LAB — COMPREHENSIVE METABOLIC PANEL
AG Ratio: 1.3 (calc) (ref 1.0–2.5)
ALT: 21 U/L (ref 9–46)
AST: 20 U/L (ref 10–35)
Albumin: 4.1 g/dL (ref 3.6–5.1)
Alkaline phosphatase (APISO): 74 U/L (ref 35–144)
BUN: 13 mg/dL (ref 7–25)
CO2: 23 mmol/L (ref 20–32)
Calcium: 9.2 mg/dL (ref 8.6–10.3)
Chloride: 101 mmol/L (ref 98–110)
Creat: 0.93 mg/dL (ref 0.70–1.33)
Globulin: 3.1 g/dL (calc) (ref 1.9–3.7)
Glucose, Bld: 101 mg/dL — ABNORMAL HIGH (ref 65–99)
Potassium: 3.5 mmol/L (ref 3.5–5.3)
Sodium: 138 mmol/L (ref 135–146)
Total Bilirubin: 0.7 mg/dL (ref 0.2–1.2)
Total Protein: 7.2 g/dL (ref 6.1–8.1)

## 2020-05-16 LAB — LIPID PANEL
Cholesterol: 208 mg/dL — ABNORMAL HIGH (ref <200)
HDL: 59 mg/dL (ref >=40)
LDL Cholesterol (Calc): 123 mg/dL (calc) — ABNORMAL HIGH
Non-HDL Cholesterol (Calc): 149 mg/dL (calc) — ABNORMAL HIGH (ref <130)
Total CHOL/HDL Ratio: 3.5 (calc) (ref <5.0)
Triglycerides: 149 mg/dL (ref <150)

## 2020-05-16 LAB — PROTEIN / CREATININE RATIO, URINE
Creatinine, Urine: 189 mg/dL (ref 20–320)
Protein/Creat Ratio: 53 mg/g creat (ref 22–128)
Protein/Creatinine Ratio: 0.053 mg/mg creat (ref 0.022–0.12)
Total Protein, Urine: 10 mg/dL (ref 5–25)

## 2020-05-16 LAB — HEPATITIS C ANTIBODY
Hepatitis C Ab: NONREACTIVE
SIGNAL TO CUT-OFF: 0.01 (ref <1.00)

## 2020-05-19 ENCOUNTER — Encounter: Payer: Self-pay | Admitting: Infectious Disease

## 2020-06-02 ENCOUNTER — Ambulatory Visit: Payer: Self-pay | Admitting: Infectious Disease

## 2020-06-18 ENCOUNTER — Ambulatory Visit: Payer: Self-pay | Admitting: Infectious Disease

## 2020-06-18 ENCOUNTER — Other Ambulatory Visit: Payer: Self-pay | Admitting: *Deleted

## 2020-06-18 MED ORDER — BIKTARVY 50-200-25 MG PO TABS: 1.0000 | ORAL_TABLET | Freq: Every day | ORAL | 11 refills | Status: DC

## 2020-06-18 MED ORDER — HYDROCHLOROTHIAZIDE 25 MG PO TABS: 25.0000 mg | ORAL_TABLET | Freq: Every day | ORAL | 11 refills | Status: DC

## 2020-06-18 NOTE — Telephone Encounter (Signed)
PT. WALKED IN AND SAID HE NEEDED HIS PRESCRIPTIONS REFILLED AT [facility] ON [address]. pUT REFILLS . hE WAS SUPPOSED TO SEE dR. vAN dAM TODAY BUT APPT. WAS CANCELED.

## 2020-06-19 ENCOUNTER — Ambulatory Visit: Payer: Self-pay | Admitting: Infectious Disease

## 2020-07-07 ENCOUNTER — Encounter: Payer: Self-pay | Admitting: Infectious Disease

## 2020-07-07 ENCOUNTER — Other Ambulatory Visit: Payer: Self-pay

## 2020-07-07 ENCOUNTER — Encounter: Payer: Self-pay | Admitting: *Deleted

## 2020-07-07 ENCOUNTER — Ambulatory Visit (INDEPENDENT_AMBULATORY_CARE_PROVIDER_SITE_OTHER): Payer: Self-pay | Admitting: Infectious Disease

## 2020-07-07 VITALS — BP 140/87 | Temp 98.5°F | Wt 218.0 lb

## 2020-07-07 DIAGNOSIS — B20 Human immunodeficiency virus [HIV] disease: Secondary | ICD-10-CM

## 2020-07-07 DIAGNOSIS — I10 Essential (primary) hypertension: Secondary | ICD-10-CM

## 2020-07-07 DIAGNOSIS — Z23 Encounter for immunization: Secondary | ICD-10-CM

## 2020-07-07 DIAGNOSIS — H538 Other visual disturbances: Secondary | ICD-10-CM

## 2020-07-07 LAB — HIV-1 RNA QUANT-NO REFLEX-BLD
CD4 % Helper T Cell: 24.6
CD4 Count: 1009
CD8 % Suppressor T Cell: 43.5
CD8 T Cell Abs: 1784
HIV-1 RNA Viral Load: <40

## 2020-07-07 NOTE — Progress Notes (Signed)
Subjective:   Chief complaint: He is here for follow-up for his HIV disease   Patient ID: [name], male    DOB: September 09, 1971, 49 y.o.   MRN: [id number]  HPI Mr Dematteo is here follow-up for his HIV disease, hypertension and smoking.  He remains on Biktarvy and is suppressed.  He was referred to ophthalmology but when he came to the ophthalmology appointment he was told he needed to supply 100 hours which he did not have insight was not seen.  He appears to be taking his blood pressure medications more consistently though the blurry vision has not improved.  He has received 2 vaccines for COVID-19.  He is continuing to follow with [facility] research and had recent labs there which showed him to have undetectable viral load and healthy CD4 count.     Past Medical History:  Diagnosis Date  . Blood per rectum 11/07/2018  . Blurry vision 02/03/2016  . Depression   . Fever, unspecified 07/29/2015  . HIV disease ([facility]) 02/24/2015  . HIV infection ([facility])   . Hypertension   . Low testosterone 02/24/2015  . Lower back pain 07/29/2015  . Smoker 01/30/2020  . Tinea pedis 07/29/2015    No past surgical history on file.  No family history on file.    Social History   Socioeconomic History  . Marital status: Legally Separated    Spouse name: Not on file  . Number of children: Not on file  . Years of education: Not on file  . Highest education level: Not on file  Occupational History  . Not on file  Tobacco Use  . Smoking status: Current Every Day Smoker    Packs/day: 0.50    Types: Cigarettes  . Smokeless tobacco: Never Used  . Tobacco comment: pt. not ready to quit smoking at this timeA1/2PPD  Substance and Sexual Activity  . Alcohol use: Yes    Alcohol/week: 3.0 standard drinks    Types: 3 Standard drinks or equivalent per week    Comment: socially  . Drug use: No  . Sexual activity: Not Currently    Comment: delcined condoms  Other Topics Concern  . Not on file  Social  History Narrative  . Not on file   Social Determinants of Health   Financial Resource Strain:   . Difficulty of Paying Living Expenses: Not on file  Food Insecurity:   . Worried About Programme researcher, broadcasting/film/video in the Last Year: Not on file  . Ran Out of Food in the Last Year: Not on file  Transportation Needs:   . Lack of Transportation (Medical): Not on file  . Lack of Transportation (Non-Medical): Not on file  Physical Activity:   . Days of Exercise per Week: Not on file  . Minutes of Exercise per Session: Not on file  Stress:   . Feeling of Stress : Not on file  Social Connections:   . Frequency of Communication with Friends and Family: Not on file  . Frequency of Social Gatherings with Friends and Family: Not on file  . Attends Religious Services: Not on file  . Active Member of Clubs or Organizations: Not on file  . Attends Banker Meetings: Not on file  . Marital Status: Not on file    No Known Allergies   Current Outpatient Medications:  .  bictegravir-emtricitabine-tenofovir AF (BIKTARVY) 50-200-25 MG TABS tablet, Take 1 tablet by mouth daily., Disp: 30 tablet, Rfl: 11 .  hydrochlorothiazide (HYDRODIURIL) 25 MG  tablet, Take 1 tablet (25 mg total) by mouth daily., Disp: 30 tablet, Rfl: 11 .  valACYclovir (VALTREX) 1000 MG tablet, Take 1 tablet (1,000 mg total) by mouth daily. (Patient not taking: Reported on 07/12/2018), Disp: 30 tablet, Rfl: 5  Review of Systems  Constitutional: Negative for activity change, appetite change, chills, diaphoresis, fever and unexpected weight change.  HENT: Negative for congestion, rhinorrhea, sinus pressure, sneezing, sore throat and trouble swallowing.   Eyes: Positive for visual disturbance. Negative for photophobia.  Respiratory: Negative for cough, chest tightness, shortness of breath, wheezing and stridor.   Cardiovascular: Negative for chest pain, palpitations and leg swelling.  Gastrointestinal: Negative for abdominal pain,  anal bleeding, blood in stool, diarrhea, nausea and vomiting.  Genitourinary: Negative for difficulty urinating, dysuria, flank pain and hematuria.  Musculoskeletal: Negative for arthralgias, back pain, gait problem, joint swelling and myalgias.  Skin: Negative for color change, pallor, rash and wound.  Neurological: Negative for dizziness, tremors, weakness and light-headedness.  Hematological: Negative for adenopathy. Does not bruise/bleed easily.  Psychiatric/Behavioral: Negative for agitation, behavioral problems, confusion, decreased concentration, dysphoric mood, self-injury and sleep disturbance.       Objective:   Physical Exam Constitutional:      General: He is not in acute distress.    Appearance: Normal appearance. He is well-developed. He is obese. He is not ill-appearing.  HENT:     Head: Normocephalic and atraumatic.     Nose: Nose normal.     Mouth/Throat:     Mouth: Mucous membranes are moist.  Cardiovascular:     Rate and Rhythm: Normal rate and regular rhythm.  Pulmonary:     Effort: Pulmonary effort is normal. No respiratory distress.     Breath sounds: No wheezing.  Abdominal:     General: There is no distension.  Musculoskeletal:        General: No tenderness. Normal range of motion.     Cervical back: Normal range of motion and neck supple.  Skin:    General: Skin is warm and dry.     Coloration: Skin is not pale.     Findings: No erythema or rash.  Neurological:     General: No focal deficit present.     Mental Status: He is alert and oriented to person, place, and time. Mental status is at baseline.  Psychiatric:        Attention and Perception: Attention normal.        Mood and Affect: Mood normal. Mood is not anxious.        Speech: Speech normal.        Behavior: Behavior normal. Behavior is cooperative.        Thought Content: Thought content normal.        Cognition and Memory: Cognition normal.        Judgment: Judgment normal.            Assessment & Plan:  HIV disease: Continue Biktarvy he is renewed HMA P I would like him to get on ACA plan using this if possible  Hypertension: Blood pressure better controlled today  Obesity: could try carbohydrate restriction in the future  Blurry vision: He really needs to be seen by ophthalmology and needs an ACA plan if he cannot afford co-pays.  Option would be if [employer] funds to be used to potentially pay for his co-pay for the ophthalmologist.      Smoking

## 2020-07-09 ENCOUNTER — Encounter: Payer: Self-pay | Admitting: Infectious Disease

## 2020-07-09 LAB — CD4/CD8 (T-HELPER/T-SUPPRESSOR CELL): HIV 1 RNA UltraQuant: <40

## 2020-08-06 ENCOUNTER — Encounter (INDEPENDENT_AMBULATORY_CARE_PROVIDER_SITE_OTHER): Payer: Self-pay | Admitting: *Deleted

## 2020-08-06 ENCOUNTER — Encounter: Payer: Self-pay | Admitting: *Deleted

## 2020-08-06 ENCOUNTER — Other Ambulatory Visit: Payer: Self-pay

## 2020-08-06 VITALS — BP 154/92 | HR 75 | Temp 98.0°F | Wt 224.2 lb

## 2020-08-06 DIAGNOSIS — Z006 Encounter for examination for normal comparison and control in clinical research program: Secondary | ICD-10-CM

## 2020-08-06 NOTE — Research (Signed)
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 BP elevated today, 154/92. States that he ran out of his BP medication about 10days ago. States that he has had an occasional headache in the evenings since he ran out of his medication. He has taken ibuprofen for the headache with relief. No other complaints verbalized. States that the pharmacy "usually calls me" when the prescription is ready for pick up. Looks like he has refills available. Instructed him to call the pharmacy today about having it refilled. Agreed to call them later today. Rechecked BP after sitting for 5 minutes, BP 168/81. States that he just switched to [place] last month. Denies any problems or adverse effects. Verbalized excellent adherence. He is leaving at the end of the month to go visit family in [place]. Will be gone until December. He will return in September for his next annual study visit and in February to see Dr. Daiva Eves.

## 2020-08-07 LAB — HEPATITIS C ANTIBODY
Hepatitis C Ab: NONREACTIVE
SIGNAL TO CUT-OFF: 0.01 (ref <1.00)

## 2020-08-07 LAB — COMPREHENSIVE METABOLIC PANEL
AG Ratio: 1.3 (calc) (ref 1.0–2.5)
ALT: 22 U/L (ref 9–46)
AST: 24 U/L (ref 10–35)
Albumin: 4.1 g/dL (ref 3.6–5.1)
Alkaline phosphatase (APISO): 85 U/L (ref 35–144)
BUN: 14 mg/dL (ref 7–25)
CO2: 23 mmol/L (ref 20–32)
Calcium: 9 mg/dL (ref 8.6–10.3)
Chloride: 107 mmol/L (ref 98–110)
Creat: 1.02 mg/dL (ref 0.70–1.33)
Globulin: 3.2 g/dL (calc) (ref 1.9–3.7)
Glucose, Bld: 102 mg/dL — ABNORMAL HIGH (ref 65–99)
Potassium: 3.9 mmol/L (ref 3.5–5.3)
Sodium: 141 mmol/L (ref 135–146)
Total Bilirubin: 0.3 mg/dL (ref 0.2–1.2)
Total Protein: 7.3 g/dL (ref 6.1–8.1)

## 2020-09-30 ENCOUNTER — Other Ambulatory Visit: Payer: Self-pay | Admitting: *Deleted

## 2020-09-30 MED ORDER — HYDROCHLOROTHIAZIDE 25 MG PO TABS
25.0000 mg | ORAL_TABLET | Freq: Every day | ORAL | 11 refills | Status: DC
Start: 2020-09-30 — End: 2021-04-29

## 2020-09-30 MED ORDER — BIKTARVY 50-200-25 MG PO TABS
1.0000 | ORAL_TABLET | Freq: Every day | ORAL | 11 refills | Status: DC
Start: 2020-09-30 — End: 2021-04-29

## 2020-10-19 ENCOUNTER — Emergency Department (HOSPITAL_COMMUNITY)
Admission: EM | Admit: 2020-10-19 | Discharge: 2020-10-19 | Disposition: A | Discharge status: Home / Self Care | Payer: Self-pay | Attending: Emergency Medicine | Admitting: Emergency Medicine

## 2020-10-19 ENCOUNTER — Encounter (HOSPITAL_COMMUNITY): Payer: Self-pay | Admitting: Emergency Medicine

## 2020-10-19 ENCOUNTER — Other Ambulatory Visit: Payer: Self-pay

## 2020-10-19 DIAGNOSIS — X501XXA Overexertion from prolonged static or awkward postures, initial encounter: Secondary | ICD-10-CM | POA: Insufficient documentation

## 2020-10-19 DIAGNOSIS — I10 Essential (primary) hypertension: Secondary | ICD-10-CM | POA: Insufficient documentation

## 2020-10-19 DIAGNOSIS — B20 Human immunodeficiency virus [HIV] disease: Secondary | ICD-10-CM | POA: Insufficient documentation

## 2020-10-19 DIAGNOSIS — F17210 Nicotine dependence, cigarettes, uncomplicated: Secondary | ICD-10-CM | POA: Insufficient documentation

## 2020-10-19 DIAGNOSIS — S39012A Strain of muscle, fascia and tendon of lower back, initial encounter: Secondary | ICD-10-CM | POA: Insufficient documentation

## 2020-10-19 DIAGNOSIS — Z79899 Other long term (current) drug therapy: Secondary | ICD-10-CM | POA: Insufficient documentation

## 2020-10-19 MED ORDER — CYCLOBENZAPRINE HCL 10 MG PO TABS: 10.0000 mg | ORAL_TABLET | Freq: Three times a day (TID) | ORAL | 0 refills | Status: DC | PRN

## 2020-10-19 MED ORDER — KETOROLAC TROMETHAMINE 60 MG/2ML IM SOLN
30.0000 mg | Freq: Once | INTRAMUSCULAR | Status: AC
  Administered 2020-10-19: 30 mg via INTRAMUSCULAR
  Filled 2020-10-19: qty 2

## 2020-10-19 NOTE — Discharge Instructions (Addendum)
If you develop worsening, recurrent, or continued back pain, numbness or weakness in the legs, incontinence of your bowels or bladders, numbness of your buttocks, fever, abdominal pain, or any other new/concerning symptoms then return to the ER for evaluation.  

## 2020-10-19 NOTE — ED Triage Notes (Signed)
C/o R lower back pain since bending over yesterday to pick up something.  Denies urinary complaints.

## 2020-10-19 NOTE — ED Provider Notes (Signed)
[facility] EMERGENCY DEPARTMENT Provider Note   CSN: [id number] Arrival date & time: 10/19/20  1321     History Chief Complaint  Patient presents with  . Back Pain    [name] is a 50 y.o. male.  HPI 50 year old male presents with acute low back pain.  Started yesterday after he bent over and then as he stood up he developed sudden right low back pain.  Goes around to his side a little bit.  No vomiting, measured fever, urinary symptoms or incontinence.  No weakness or numbness in his legs but it does hurt worse to walk on the right leg.  No direct trauma.  He gets pain like this on and off though this is more severe.  Tried some ibuprofen and Tylenol. Didn't take his BP meds this morning.  Past Medical History:  Diagnosis Date  . Blood per rectum 11/07/2018  . Blurry vision 02/03/2016  . Depression   . Fever, unspecified 07/29/2015  . HIV disease ([facility]) 02/24/2015  . HIV infection ([facility])   . Hypertension   . Low testosterone 02/24/2015  . Lower back pain 07/29/2015  . Smoker 01/30/2020  . Tinea pedis 07/29/2015    Patient Active Problem List   Diagnosis Date Noted  . Smoker 01/30/2020  . Blood per rectum 11/07/2018  . Hearing loss, left  09/05/2017  . Healthcare maintenance 09/05/2017  . Blurry vision 02/03/2016  . Low back pain 07/29/2015  . Low testosterone 02/24/2015  . HTN (hypertension) 02/24/2015  . HIV disease ([facility]CC) 02/24/2015  . Gynecomastia 08/16/2014  . Human immunodeficiency virus (HIV) disease ([facility]) 11/08/2006    History reviewed. No pertinent surgical history.     No family history on file.  Social History   Tobacco Use  . Smoking status: Current Every Day Smoker    Packs/day: 0.50    Types: Cigarettes  . Smokeless tobacco: Never Used  . Tobacco comment: pt. not ready to quit smoking at this timeA1/2PPD  Substance Use Topics  . Alcohol use: Yes    Alcohol/week: 3.0 standard drinks    Types: 3 Standard drinks or equivalent  per week    Comment: socially  . Drug use: No    Home Medications Prior to Admission medications   Medication Sig Start Date End Date Taking? Authorizing Provider  cyclobenzaprine (FLEXERIL) 10 MG tablet Take 1 tablet (10 mg total) by mouth 3 (three) times daily as needed for muscle spasms. 10/19/20  Yes Pricilla Loveless, MD  bictegravir-emtricitabine-tenofovir AF (BIKTARVY) 50-200-25 MG TABS tablet Take 1 tablet by mouth daily. 09/30/20   Randall Hiss, MD  hydrochlorothiazide (HYDRODIURIL) 25 MG tablet Take 1 tablet (25 mg total) by mouth daily. 09/30/20   Randall Hiss, MD  valACYclovir (VALTREX) 1000 MG tablet Take 1 tablet (1,000 mg total) by mouth daily. Patient not taking: Reported on 07/12/2018 02/03/16   Daiva Eves, Lisette Grinder, MD    Allergies    Patient has no known allergies.  Review of Systems   Review of Systems  Constitutional: Negative for fever.  Genitourinary: Negative for dysuria and hematuria.       No incontinence  Musculoskeletal: Positive for back pain.  Neurological: Negative for weakness and numbness.    Physical Exam Updated Vital Signs BP (!) 152/94 (BP Location: Right Arm)   Pulse 86   Temp 98.2 F (36.8 C) (Oral)   Resp 19   SpO2 100%   Physical Exam Vitals and nursing note reviewed.  Constitutional:      General: He is not in acute distress.    Appearance: He is well-developed and well-nourished. He is not diaphoretic.  HENT:     Head: Normocephalic and atraumatic.     Right Ear: External ear normal.     Left Ear: External ear normal.     Nose: Nose normal.  Eyes:     General:        Right eye: No discharge.        Left eye: No discharge.  Cardiovascular:     Rate and Rhythm: Normal rate and regular rhythm.     Heart sounds: Normal heart sounds.  Pulmonary:     Effort: Pulmonary effort is normal.     Breath sounds: Normal breath sounds.  Abdominal:     General: There is no distension.     Palpations: Abdomen is soft.      Tenderness: There is no abdominal tenderness.  Musculoskeletal:        General: No edema.     Cervical back: Neck supple.     Thoracic back: No tenderness.     Lumbar back: Tenderness (mild) present. No bony tenderness.       Back:     Comments: No rash  Skin:    General: Skin is warm and dry.  Neurological:     Mental Status: He is alert.     Comments: 5/5 strength in BLE. Normal gross sensation. Able to ambulate  Psychiatric:        Mood and Affect: Mood is not anxious.     ED Results / Procedures / Treatments   Labs (all labs ordered are listed, but only abnormal results are displayed) Labs Reviewed - No data to display  EKG None  Radiology No results found.  Procedures Procedures (including critical care time)  Medications Ordered in ED Medications  ketorolac (TORADOL) injection 30 mg (has no administration in time range)    ED Course  I have reviewed the triage vital signs and the nursing notes.  Pertinent labs & imaging results that were available during my care of the patient were reviewed by me and considered in my medical decision making (see chart for details).    MDM Rules/Calculators/A&P                          Patient with acute lumbar strain.  While he is hypertensive, he forgot to take his blood pressure meds this morning.  Given his acute presentation my primary diagnosis is lumbar strain.  I have low suspicion for ureteral/kidney stone, AAA, retroperitoneal emergency or abdominal emergency.  No urinary symptoms.  Will discharge home with muscle relaxers and advised follow-up with PCP.  Return precautions given. IM torardol here. Final Clinical Impression(s) / ED Diagnoses Final diagnoses:  Strain of lumbar region, initial encounter    Rx / DC Orders ED Discharge Orders         Ordered    cyclobenzaprine (FLEXERIL) 10 MG tablet  3 times daily PRN        10/19/20 1658           Pricilla Loveless, MD 10/19/20 1704

## 2020-12-10 ENCOUNTER — Other Ambulatory Visit: Payer: Self-pay

## 2020-12-10 ENCOUNTER — Encounter: Payer: Self-pay | Admitting: Infectious Disease

## 2020-12-10 ENCOUNTER — Ambulatory Visit (INDEPENDENT_AMBULATORY_CARE_PROVIDER_SITE_OTHER): Payer: Self-pay | Admitting: Infectious Disease

## 2020-12-10 VITALS — BP 124/78 | HR 88 | Temp 98.1°F | Wt 224.0 lb

## 2020-12-10 DIAGNOSIS — H538 Other visual disturbances: Secondary | ICD-10-CM

## 2020-12-10 DIAGNOSIS — B20 Human immunodeficiency virus [HIV] disease: Secondary | ICD-10-CM

## 2020-12-10 DIAGNOSIS — I10 Essential (primary) hypertension: Secondary | ICD-10-CM

## 2020-12-10 DIAGNOSIS — M545 Low back pain, unspecified: Secondary | ICD-10-CM

## 2020-12-10 NOTE — Progress Notes (Signed)
Subjective:   Chief complaint: He is here for follow-up for his HIV disease, still complaining of blurry vision and recent problems with lower back pain for which he went to the emergency department   Patient ID: [name], male    DOB: [birth year], 50 y.o.   MRN: [id number]  HPI [name] is here follow-up for his HIV disease, hypertension and smoking.  He remains on Biktarvy and is suppressed.  He was referred to ophthalmology but when he came to the ophthalmology appointment he was told he needed to supply 100 hours which he did not have insight was not seen.  He appears to be taking his blood pressure medications more consistently though the blurry vision has not improved.  He recently went to the ER with severe low back pain which is now improving.  He continues to have blurry vision blood pressure is improved upon measurement here at [facility]       Past Medical History:  Diagnosis Date  . Blood per rectum 11/07/2018  . Blurry vision 02/03/2016  . Depression   . Fever, unspecified 07/29/2015  . HIV disease (HCC) 02/24/2015  . HIV infection (HCC)   . Hypertension   . Low testosterone 02/24/2015  . Lower back pain 07/29/2015  . Smoker 01/30/2020  . Tinea pedis 07/29/2015    No past surgical history on file.  No family history on file.    Social History   Socioeconomic History  . Marital status: Legally Separated    Spouse name: Not on file  . Number of children: Not on file  . Years of education: Not on file  . Highest education level: Not on file  Occupational History  . Not on file  Tobacco Use  . Smoking status: Current Every Day Smoker    Packs/day: 0.50    Types: Cigarettes  . Smokeless tobacco: Never Used  . Tobacco comment: pt. not ready to quit smoking at this timeA1/2PPD  Substance and Sexual Activity  . Alcohol use: Yes    Alcohol/week: 3.0 standard drinks    Types: 3 Standard drinks or equivalent per week    Comment: socially  . Drug use: No  .  Sexual activity: Yes    Comment: delcined condoms  Other Topics Concern  . Not on file  Social History Narrative  . Not on file   Social Determinants of Health   Financial Resource Strain: Not on file  Food Insecurity: Not on file  Transportation Needs: Not on file  Physical Activity: Not on file  Stress: Not on file  Social Connections: Not on file    No Known Allergies   Current Outpatient Medications:  .  bictegravir-emtricitabine-tenofovir AF (BIKTARVY) 50-200-25 MG TABS tablet, Take 1 tablet by mouth daily., Disp: 30 tablet, Rfl: 11 .  cyclobenzaprine (FLEXERIL) 10 MG tablet, Take 1 tablet (10 mg total) by mouth 3 (three) times daily as needed for muscle spasms., Disp: 15 tablet, Rfl: 0 .  hydrochlorothiazide (HYDRODIURIL) 25 MG tablet, Take 1 tablet (25 mg total) by mouth daily., Disp: 30 tablet, Rfl: 11 .  valACYclovir (VALTREX) 1000 MG tablet, Take 1 tablet (1,000 mg total) by mouth daily. (Patient not taking: No sig reported), Disp: 30 tablet, Rfl: 5  Review of Systems  Constitutional: Negative for activity change, appetite change, chills, diaphoresis, fever and unexpected weight change.  HENT: Negative for congestion, rhinorrhea, sinus pressure, sneezing, sore throat and trouble swallowing.   Eyes: Positive for visual disturbance. Negative for photophobia.  Respiratory: Negative for cough, chest tightness, shortness of breath, wheezing and stridor.   Cardiovascular: Negative for chest pain, palpitations and leg swelling.  Gastrointestinal: Negative for abdominal pain, anal bleeding, blood in stool, diarrhea, nausea and vomiting.  Genitourinary: Negative for difficulty urinating, dysuria, flank pain and hematuria.  Musculoskeletal: Positive for back pain. Negative for arthralgias, gait problem, joint swelling and myalgias.  Skin: Negative for color change, pallor, rash and wound.  Neurological: Negative for dizziness, tremors, weakness and light-headedness.   Hematological: Negative for adenopathy. Does not bruise/bleed easily.  Psychiatric/Behavioral: Negative for agitation, behavioral problems, confusion, decreased concentration, dysphoric mood, self-injury and sleep disturbance.       Objective:   Physical Exam Constitutional:      General: He is not in acute distress.    Appearance: Normal appearance. He is well-developed. He is obese. He is not ill-appearing.  HENT:     Head: Normocephalic and atraumatic.     Nose: Nose normal.     Mouth/Throat:     Mouth: Mucous membranes are moist.  Eyes:     Extraocular Movements: Extraocular movements intact.  Cardiovascular:     Rate and Rhythm: Normal rate and regular rhythm.  Pulmonary:     Effort: Pulmonary effort is normal. No respiratory distress.     Breath sounds: No wheezing.  Abdominal:     General: There is no distension.  Musculoskeletal:        General: No tenderness. Normal range of motion.     Cervical back: Normal range of motion and neck supple.  Skin:    General: Skin is warm and dry.     Coloration: Skin is not pale.     Findings: No erythema or rash.  Neurological:     General: No focal deficit present.     Mental Status: He is alert and oriented to person, place, and time. Mental status is at baseline.  Psychiatric:        Attention and Perception: Attention normal.        Mood and Affect: Mood normal. Mood is not anxious.        Speech: Speech normal.        Behavior: Behavior normal. Behavior is cooperative.        Thought Content: Thought content normal.        Cognition and Memory: Cognition normal.        Judgment: Judgment normal.           Assessment & Plan:  HIV disease: Continue Biktarvy and has renewed HMAP  Hypertension: Blood pressure well controlled  Obesity: could try carbohydrate restriction in the future  Blurry vision I have put in a new referral to ophthalmology and perhaps he now has the funds to pay for the initial visit.  I have  once again encouraged enrollment in [facility].  Low back pai:  improving     Smoking

## 2020-12-11 LAB — URINE CYTOLOGY ANCILLARY ONLY
Chlamydia: NEGATIVE
Comment: NEGATIVE
Comment: NORMAL
Neisseria Gonorrhea: NEGATIVE

## 2020-12-11 LAB — T-HELPER CELL (CD4) - (RCID CLINIC ONLY)
CD4 % Helper T Cell: 34 % (ref 33–65)
CD4 T Cell Abs: 1108 /uL (ref 400–1790)

## 2020-12-12 LAB — COMPLETE METABOLIC PANEL WITH GFR
AG Ratio: 1.3 (calc) (ref 1.0–2.5)
ALT: 28 U/L (ref 9–46)
AST: 21 U/L (ref 10–35)
Albumin: 4 g/dL (ref 3.6–5.1)
Alkaline phosphatase (APISO): 94 U/L (ref 35–144)
BUN: 16 mg/dL (ref 7–25)
CO2: 28 mmol/L (ref 20–32)
Calcium: 9.1 mg/dL (ref 8.6–10.3)
Chloride: 104 mmol/L (ref 98–110)
Creat: 0.91 mg/dL (ref 0.70–1.33)
GFR, Est African American: 113 mL/min/{1.73_m2} (ref >=60)
GFR, Est Non African American: 97 mL/min/{1.73_m2} (ref >=60)
Globulin: 3.1 g/dL (calc) (ref 1.9–3.7)
Glucose, Bld: 92 mg/dL (ref 65–99)
Potassium: 3.6 mmol/L (ref 3.5–5.3)
Sodium: 140 mmol/L (ref 135–146)
Total Bilirubin: 0.7 mg/dL (ref 0.2–1.2)
Total Protein: 7.1 g/dL (ref 6.1–8.1)

## 2020-12-12 LAB — CBC WITH DIFFERENTIAL/PLATELET
Absolute Monocytes: 592 cells/uL (ref 200–950)
Basophils Absolute: 39 cells/uL (ref 0–200)
Basophils Relative: 0.6 %
Eosinophils Absolute: 130 cells/uL (ref 15–500)
Eosinophils Relative: 2 %
HCT: 41.4 % (ref 38.5–50.0)
Hemoglobin: 14.4 g/dL (ref 13.2–17.1)
Lymphs Abs: 3465 cells/uL (ref 850–3900)
MCH: 34.1 pg — ABNORMAL HIGH (ref 27.0–33.0)
MCHC: 34.8 g/dL (ref 32.0–36.0)
MCV: 98.1 fL (ref 80.0–100.0)
MPV: 10 fL (ref 7.5–12.5)
Monocytes Relative: 9.1 %
Neutro Abs: 2275 cells/uL (ref 1500–7800)
Neutrophils Relative %: 35 %
Platelets: 264 10*3/uL (ref 140–400)
RBC: 4.22 10*6/uL (ref 4.20–5.80)
RDW: 11.8 % (ref 11.0–15.0)
Total Lymphocyte: 53.3 %
WBC: 6.5 10*3/uL (ref 3.8–10.8)

## 2020-12-12 LAB — HIV-1 RNA QUANT-NO REFLEX-BLD
HIV 1 RNA Quant: <20 Copies/mL
HIV-1 RNA Quant, Log: <1.3 Log cps/mL

## 2020-12-12 LAB — LIPID PANEL
Cholesterol: 221 mg/dL — ABNORMAL HIGH (ref <200)
HDL: 55 mg/dL (ref >=40)
LDL Cholesterol (Calc): 131 mg/dL (calc) — ABNORMAL HIGH
Non-HDL Cholesterol (Calc): 166 mg/dL (calc) — ABNORMAL HIGH (ref <130)
Total CHOL/HDL Ratio: 4 (calc) (ref <5.0)
Triglycerides: 210 mg/dL — ABNORMAL HIGH (ref <150)

## 2020-12-12 LAB — RPR: RPR Ser Ql: NONREACTIVE

## 2020-12-15 ENCOUNTER — Encounter: Payer: Self-pay | Admitting: Infectious Disease

## 2021-03-31 ENCOUNTER — Other Ambulatory Visit: Payer: Self-pay | Admitting: Infectious Diseases

## 2021-03-31 DIAGNOSIS — B20 Human immunodeficiency virus [HIV] disease: Secondary | ICD-10-CM

## 2021-04-15 LAB — HIV-1 RNA QUANT-NO REFLEX-BLD: HIV 1 RNA Quant: <40

## 2021-04-29 ENCOUNTER — Ambulatory Visit (INDEPENDENT_AMBULATORY_CARE_PROVIDER_SITE_OTHER): Payer: Self-pay | Admitting: Infectious Disease

## 2021-04-29 ENCOUNTER — Other Ambulatory Visit: Payer: Self-pay

## 2021-04-29 ENCOUNTER — Encounter: Payer: Self-pay | Admitting: Infectious Disease

## 2021-04-29 VITALS — BP 144/86 | HR 86 | Temp 98.3°F | Wt 219.4 lb

## 2021-04-29 DIAGNOSIS — M545 Low back pain, unspecified: Secondary | ICD-10-CM

## 2021-04-29 DIAGNOSIS — E669 Obesity, unspecified: Secondary | ICD-10-CM

## 2021-04-29 DIAGNOSIS — I10 Essential (primary) hypertension: Secondary | ICD-10-CM

## 2021-04-29 DIAGNOSIS — B20 Human immunodeficiency virus [HIV] disease: Secondary | ICD-10-CM

## 2021-04-29 DIAGNOSIS — Z6838 Body mass index (BMI) 38.0-38.9, adult: Secondary | ICD-10-CM

## 2021-04-29 DIAGNOSIS — H538 Other visual disturbances: Secondary | ICD-10-CM

## 2021-04-29 HISTORY — DX: Obesity, unspecified: E66.9

## 2021-04-29 MED ORDER — BIKTARVY 50-200-25 MG PO TABS: 1.0000 | ORAL_TABLET | Freq: Every day | ORAL | 11 refills | Status: DC

## 2021-04-29 MED ORDER — HYDROCHLOROTHIAZIDE 25 MG PO TABS: 25.0000 mg | ORAL_TABLET | Freq: Every day | ORAL | 11 refills | Status: DC

## 2021-04-29 MED ORDER — TIZANIDINE HCL 2 MG PO CAPS: 2.0000 mg | ORAL_CAPSULE | Freq: Three times a day (TID) | ORAL | 2 refills | Status: AC

## 2021-04-29 NOTE — Progress Notes (Signed)
Subjective:   Chief complaint: Complaining of pain in his upper back especially with rotating backwards and to the left Pain is 7/10 in severity dull exacerbated by movement minimally responsive to NSAIDS   Patient ID: [name], male    DOB: 1970-12-20, 50 y.o.   MRN: [id number]  HPI Mr Leonetti is here follow-up for his HIV disease, hypertension on medications complaining of MSK pain of upper back.   I noticed that his last visit he also c/o pain but more lower back pain and he received muscle relaxant from me at that visit which I also had offered this visit along with PT referral  He remains on Biktarvy and is suppressed.  He was seen by ophthalmology for his blurry vision and they told him he says that he just needs reading glasses though he lost his prescription.   He appears to be taking his blood pressure medications more consistently though the blurry vision has not improved.        Past Medical History:  Diagnosis Date   Blood per rectum 11/07/2018   Blurry vision 02/03/2016   Depression    Fever, unspecified 07/29/2015   HIV disease (HCC) 02/24/2015   HIV infection (HCC)    Hypertension    Low testosterone 02/24/2015   Lower back pain 07/29/2015   Smoker 01/30/2020   Tinea pedis 07/29/2015    No past surgical history on file.  No family history on file.    Social History   Socioeconomic History   Marital status: Legally Separated    Spouse name: Not on file   Number of children: Not on file   Years of education: Not on file   Highest education level: Not on file  Occupational History   Not on file  Tobacco Use   Smoking status: Every Day    Packs/day: 0.50    Pack years: 0.00    Types: Cigarettes   Smokeless tobacco: Never   Tobacco comments:    pt. not ready to quit smoking at this timeA1/2PPD  Substance and Sexual Activity   Alcohol use: Yes    Alcohol/week: 3.0 standard drinks    Types: 3 Standard drinks or equivalent per week    Comment:  socially   Drug use: No   Sexual activity: Yes    Comment: declined condoms  Other Topics Concern   Not on file  Social History Narrative   Not on file   Social Determinants of Health   Financial Resource Strain: Not on file  Food Insecurity: Not on file  Transportation Needs: Not on file  Physical Activity: Not on file  Stress: Not on file  Social Connections: Not on file    No Known Allergies   Current Outpatient Medications:    tizanidine (ZANAFLEX) 2 MG capsule, Take 1 capsule (2 mg total) by mouth 3 (three) times daily for 10 days., Disp: 30 capsule, Rfl: 2   bictegravir-emtricitabine-tenofovir AF (BIKTARVY) 50-200-25 MG TABS tablet, Take 1 tablet by mouth daily., Disp: 30 tablet, Rfl: 11   hydrochlorothiazide (HYDRODIURIL) 25 MG tablet, Take 1 tablet (25 mg total) by mouth daily., Disp: 30 tablet, Rfl: 11   valACYclovir (VALTREX) 1000 MG tablet, Take 1 tablet (1,000 mg total) by mouth daily. (Patient not taking: No sig reported), Disp: 30 tablet, Rfl: 5  Review of Systems  Constitutional:  Negative for activity change, appetite change, chills, diaphoresis, fatigue, fever and unexpected weight change.  HENT:  Negative for congestion, rhinorrhea, sinus pressure, sneezing,  sore throat and trouble swallowing.   Eyes:  Negative for photophobia and visual disturbance.  Respiratory:  Negative for cough, chest tightness, shortness of breath, wheezing and stridor.   Cardiovascular:  Negative for chest pain, palpitations and leg swelling.  Gastrointestinal:  Negative for abdominal distention, abdominal pain, anal bleeding, blood in stool, constipation, diarrhea, nausea and vomiting.  Genitourinary:  Negative for difficulty urinating, dysuria, flank pain and hematuria.  Musculoskeletal:  Positive for back pain. Negative for arthralgias, gait problem, joint swelling and myalgias.  Skin:  Negative for color change, pallor, rash and wound.  Neurological:  Negative for dizziness,  tremors, weakness, light-headedness and headaches.  Hematological:  Negative for adenopathy. Does not bruise/bleed easily.  Psychiatric/Behavioral:  Negative for agitation, behavioral problems, confusion, decreased concentration, dysphoric mood, sleep disturbance and suicidal ideas.       Objective:   Physical Exam Constitutional:      General: He is not in acute distress.    Appearance: Normal appearance. He is well-developed. He is not ill-appearing or diaphoretic.  HENT:     Head: Normocephalic and atraumatic.     Right Ear: Hearing and external ear normal.     Left Ear: Hearing and external ear normal.     Nose: No nasal deformity or rhinorrhea.  Eyes:     General: No scleral icterus.    Extraocular Movements: Extraocular movements intact.     Conjunctiva/sclera: Conjunctivae normal.     Right eye: Right conjunctiva is not injected.     Left eye: Left conjunctiva is not injected.     Pupils: Pupils are equal, round, and reactive to light.  Neck:     Vascular: No JVD.  Cardiovascular:     Rate and Rhythm: Normal rate and regular rhythm.     Heart sounds: S1 normal and S2 normal.  Pulmonary:     Effort: Pulmonary effort is normal. No respiratory distress.     Breath sounds: No wheezing.  Abdominal:     General: Bowel sounds are normal. There is no distension.     Palpations: Abdomen is soft.     Tenderness: There is no abdominal tenderness.  Musculoskeletal:     Right shoulder: Normal.     Left shoulder: Normal.     Cervical back: Normal range of motion and neck supple.     Thoracic back: Spasms present. Decreased range of motion.     Right hip: Normal.     Left hip: Normal.     Right knee: Normal.     Left knee: Normal.  Lymphadenopathy:     Head:     Right side of head: No submandibular, preauricular or posterior auricular adenopathy.     Left side of head: No submandibular, preauricular or posterior auricular adenopathy.     Cervical: No cervical adenopathy.      Right cervical: No superficial or deep cervical adenopathy.    Left cervical: No superficial or deep cervical adenopathy.  Skin:    General: Skin is warm and dry.     Coloration: Skin is not pale.     Findings: No abrasion, bruising, ecchymosis, erythema, lesion or rash.     Nails: There is no clubbing.  Neurological:     General: No focal deficit present.     Mental Status: He is alert and oriented to person, place, and time. Mental status is at baseline.     Sensory: No sensory deficit.     Coordination: Coordination normal.  Gait: Gait normal.  Psychiatric:        Attention and Perception: He is attentive.        Mood and Affect: Mood normal.        Speech: Speech normal.        Behavior: Behavior normal. Behavior is cooperative.        Thought Content: Thought content normal.        Judgment: Judgment normal.          Assessment & Plan:  HIV disease: continue Biktarvy and check labs, renew HMAP  Hypertension: Blood pressure up a bit but possibly due to pain  Obesity: carb restriction would help him  Blurry vision reportedly issue of needing glasses  Upper back pain: will give rx for muscle relaxant and referral to PT but will not give muscle relaxant in future as is 2ndd appt in a row he is appearing to ask for meds and I want the cause of his problems to be addressed not just symptomatic management

## 2021-04-30 LAB — URINE CYTOLOGY ANCILLARY ONLY
Chlamydia: NEGATIVE
Comment: NEGATIVE
Comment: NORMAL
Neisseria Gonorrhea: NEGATIVE

## 2021-04-30 LAB — T-HELPER CELL (CD4) - (RCID CLINIC ONLY)
CD4 % Helper T Cell: 33 % (ref 33–65)
CD4 T Cell Abs: 985 /uL (ref 400–1790)

## 2021-05-02 LAB — CBC WITH DIFFERENTIAL/PLATELET
Absolute Monocytes: 486 cells/uL (ref 200–950)
Basophils Absolute: 78 cells/uL (ref 0–200)
Basophils Relative: 1.3 %
Eosinophils Absolute: 120 cells/uL (ref 15–500)
Eosinophils Relative: 2 %
HCT: 42.6 % (ref 38.5–50.0)
Hemoglobin: 14.9 g/dL (ref 13.2–17.1)
Lymphs Abs: 3210 cells/uL (ref 850–3900)
MCH: 33.9 pg — ABNORMAL HIGH (ref 27.0–33.0)
MCHC: 35 g/dL (ref 32.0–36.0)
MCV: 97 fL (ref 80.0–100.0)
MPV: 9.8 fL (ref 7.5–12.5)
Monocytes Relative: 8.1 %
Neutro Abs: 2106 cells/uL (ref 1500–7800)
Neutrophils Relative %: 35.1 %
Platelets: 236 10*3/uL (ref 140–400)
RBC: 4.39 10*6/uL (ref 4.20–5.80)
RDW: 12.3 % (ref 11.0–15.0)
Total Lymphocyte: 53.5 %
WBC: 6 10*3/uL (ref 3.8–10.8)

## 2021-05-02 LAB — COMPLETE METABOLIC PANEL WITH GFR
AG Ratio: 1.4 (calc) (ref 1.0–2.5)
ALT: 25 U/L (ref 9–46)
AST: 22 U/L (ref 10–35)
Albumin: 4.2 g/dL (ref 3.6–5.1)
Alkaline phosphatase (APISO): 84 U/L (ref 35–144)
BUN: 14 mg/dL (ref 7–25)
CO2: 26 mmol/L (ref 20–32)
Calcium: 8.9 mg/dL (ref 8.6–10.3)
Chloride: 105 mmol/L (ref 98–110)
Creat: 0.87 mg/dL (ref 0.70–1.30)
Globulin: 2.9 g/dL (calc) (ref 1.9–3.7)
Glucose, Bld: 108 mg/dL — ABNORMAL HIGH (ref 65–99)
Potassium: 3.6 mmol/L (ref 3.5–5.3)
Sodium: 141 mmol/L (ref 135–146)
Total Bilirubin: 0.7 mg/dL (ref 0.2–1.2)
Total Protein: 7.1 g/dL (ref 6.1–8.1)
eGFR: 104 mL/min/{1.73_m2} (ref >=60)

## 2021-05-02 LAB — LIPID PANEL
Cholesterol: 216 mg/dL — ABNORMAL HIGH (ref <200)
HDL: 65 mg/dL (ref >=40)
LDL Cholesterol (Calc): 126 mg/dL (calc) — ABNORMAL HIGH
Non-HDL Cholesterol (Calc): 151 mg/dL (calc) — ABNORMAL HIGH (ref <130)
Total CHOL/HDL Ratio: 3.3 (calc) (ref <5.0)
Triglycerides: 130 mg/dL (ref <150)

## 2021-05-02 LAB — HIV-1 RNA QUANT-NO REFLEX-BLD
HIV 1 RNA Quant: NOT DETECTED Copies/mL
HIV-1 RNA Quant, Log: NOT DETECTED Log cps/mL

## 2021-05-02 LAB — RPR: RPR Ser Ql: NONREACTIVE

## 2021-06-25 ENCOUNTER — Other Ambulatory Visit: Payer: Self-pay

## 2021-06-25 ENCOUNTER — Ambulatory Visit (INDEPENDENT_AMBULATORY_CARE_PROVIDER_SITE_OTHER): Payer: Self-pay | Admitting: Internal Medicine

## 2021-06-25 VITALS — BP 143/86 | HR 88 | Temp 98.9°F | Resp 16 | Ht 67.5 in | Wt 217.0 lb

## 2021-06-25 DIAGNOSIS — T148XXA Other injury of unspecified body region, initial encounter: Secondary | ICD-10-CM

## 2021-06-25 DIAGNOSIS — M5414 Radiculopathy, thoracic region: Secondary | ICD-10-CM

## 2021-06-25 MED ORDER — METHOCARBAMOL 500 MG PO TABS: 500.0000 mg | ORAL_TABLET | Freq: Four times a day (QID) | ORAL | 0 refills | Status: DC

## 2021-06-25 NOTE — Patient Instructions (Signed)
I have referred you to physical therapy   Will also order mri of your back  In the mean time take tramadol and robaxin as needed for pain   Follow up with dr Daiva Eves in 3-4 weeks

## 2021-06-25 NOTE — Progress Notes (Signed)
[facility]  Patient Active Problem List   Diagnosis Date Noted   Obesity 04/29/2021   Smoker 01/30/2020   Blood per rectum 11/07/2018   Hearing loss, left  09/05/2017   Healthcare maintenance 09/05/2017   Blurry vision 02/03/2016   Lumbago 07/29/2015   Low testosterone 02/24/2015   HTN (hypertension) 02/24/2015   HIV disease (HCC) 02/24/2015   Gynecomastia 08/16/2014   Human immunodeficiency virus (HIV) disease (HCC) 11/08/2006      Subjective:    Patient ID: Selinda Eon, male    DOB: 1971-03-05, 50 y.o.   MRN: [id number]  Chief Complaint  Patient presents with   Follow-up    HPI:  Kevork Lahmann is a 50 y.o. male with hiv here for acute visit   His hiv is well  Lab Results  Component Value Date   HIV1RNAQUANT Not Detected 04/29/2021   Lab Results  Component Value Date   CD4TCELL 33 04/29/2021   CD4TABS 985 04/29/2021   He is here today complaining of upper back pain still. He was given muscle relaxant but the pain hadn't gone away  One day he woke up with the pain. Worse with activity and as the day goes by. Nothing helps it  Pain radiate to same upper extremity. He denies weakness/numbness in the left upper extremity since the pain started.   Pain is described as spasm and can be severe   No f/c No cough/chest pain   He denies rigorous physical activity at baseline or any trauma triggering the pain   No Known Allergies    Outpatient Medications Prior to Visit  Medication Sig Dispense Refill   bictegravir-emtricitabine-tenofovir AF (BIKTARVY) 50-200-25 MG TABS tablet Take 1 tablet by mouth daily. 30 tablet 11   hydrochlorothiazide (HYDRODIURIL) 25 MG tablet Take 1 tablet (25 mg total) by mouth daily. 30 tablet 11   valACYclovir (VALTREX) 1000 MG tablet Take 1 tablet (1,000 mg total) by mouth daily. (Patient not taking: No sig reported) 30 tablet 5   No facility-administered medications prior to visit.      Social History   Socioeconomic History   Marital status: Legally Separated    Spouse name: Not on file   Number of children: Not on file   Years of education: Not on file   Highest education level: Not on file  Occupational History   Not on file  Tobacco Use   Smoking status: Every Day    Packs/day: 0.50    Types: Cigarettes   Smokeless tobacco: Never   Tobacco comments:    pt. not ready to quit smoking at this timeA1/2PPD  Substance and Sexual Activity   Alcohol use: Yes    Alcohol/week: 3.0 standard drinks    Types: 3 Standard drinks or equivalent per week    Comment: socially   Drug use: No   Sexual activity: Yes    Comment: declined condoms  Other Topics Concern   Not on file  Social History Narrative   Not on file   Social Determinants of Health   Financial Resource Strain: Not on file  Food Insecurity: Not on file  Transportation Needs: Not on file  Physical Activity: Not on file  Stress: Not on file  Social Connections: Not on file  Intimate Partner Violence: Not on file      Review of Systems    Other ros negative   Objective:    BP (!) 143/86   Pulse 88  Temp 98.9 F (37.2 C)   Resp 16   Ht 5' 7.5" (1.715 m)   Wt 217 lb (98.4 kg)   SpO2 99%   BMI 33.49 kg/m  Nursing note and vital signs reviewed.  Physical Exam     General/constitutional: no distress, pleasant HEENT: Normocephalic, PER, Conj Clear, EOMI, Oropharynx clear Neck supple CV: rrr no mrg Lungs: clear to auscultation, normal respiratory effort Abd: Soft, Nontender Ext: no edema Skin: No Rash Neuro: nonfocal MSK: tender on palpation left rhomboid/paraspinal muscle. Rom intact. Strength bilateral UE symmetric. No obvious cold/touch sensation deficit bilateral UE   Labs:  Micro:  Serology:  Imaging:  Assessment & Plan:   Problem List Items Addressed This Visit   None Visit Diagnoses     Muscle strain    -  Primary   Relevant Orders   Ambulatory  referral to Physical Therapy   MR THORACIC SPINE WO CONTRAST   Thoracic radiculopathy       Relevant Medications   methocarbamol (ROBAXIN) 500 MG tablet   Other Relevant Orders   Ambulatory referral to Physical Therapy   MR THORACIC SPINE WO CONTRAST      Ddx of pain include muscle strain vs radiculopathy He tries nsaid which helps more than the muscle relaxant  Will get mri to r/o spondylosis and refer to PT Rx for muscle relaxant  Rx (hardcopy) tramadol 50 mg q6hr prn #30 Refer to physical therapy F/u dr Daiva Eves in 3-4 weeks    No orders of the defined types were placed in this encounter.       Follow-up: No follow-ups on file.      Raymondo Band, MD [facility] [phone]  pager   [phone] cell 06/25/2021, 3:31 PM

## 2021-07-01 ENCOUNTER — Other Ambulatory Visit: Payer: Self-pay

## 2021-07-01 ENCOUNTER — Encounter: Payer: Self-pay | Admitting: Physical Therapy

## 2021-07-01 ENCOUNTER — Ambulatory Visit (INDEPENDENT_AMBULATORY_CARE_PROVIDER_SITE_OTHER): Payer: Self-pay | Admitting: Physical Therapy

## 2021-07-01 DIAGNOSIS — M546 Pain in thoracic spine: Secondary | ICD-10-CM

## 2021-07-01 DIAGNOSIS — M6281 Muscle weakness (generalized): Secondary | ICD-10-CM

## 2021-07-01 DIAGNOSIS — R29898 Other symptoms and signs involving the musculoskeletal system: Secondary | ICD-10-CM

## 2021-07-01 NOTE — Patient Instructions (Signed)
Access Code: [email or web address] URL: [email or web address] Date: 07/01/2021 Prepared by: Reggy [facility]  Exercises Doorway Pec Stretch at 90 Degrees Abduction - 1 x daily - 7 x weekly - 3 sets - 1 reps - 20-30 sec hold Doorway Pec Stretch at 60 Elevation - 1 x daily - 7 x weekly - 3 sets - 1 reps - 20-30 sec hold Shoulder External Rotation and Scapular Retraction - 1 x daily - 7 x weekly - 2 sets - 10 reps Standing Shoulder Horizontal Abduction with Resistance - 1 x daily - 7 x weekly - 2 sets - 10 reps

## 2021-07-01 NOTE — Therapy (Addendum)
[facility] [place], [place], [address] Phone: [phone]   Fax:  [phone]  Physical Therapy Evaluation and discharge  Patient Details  Name: [name] MRN: [id number] Date of Birth: 1971/05/19 Referring Provider (PT): Prudencio Pair   Encounter Date: 07/01/2021   PT End of Session - 07/01/21 1147     Visit Number 1    Number of Visits 12    Date for PT Re-Evaluation 08/12/21    PT Start Time 1100    PT Stop Time 1140    PT Time Calculation (min) 40 min    Activity Tolerance Patient tolerated treatment well;Patient limited by pain    Behavior During Therapy [facility] for tasks assessed/performed             Past Medical History:  Diagnosis Date   Blood per rectum 11/07/2018   Blurry vision 02/03/2016   Depression    Fever, unspecified 07/29/2015   HIV disease ([place]) 02/24/2015   HIV infection ([place])    Hypertension    Low testosterone 02/24/2015   Lower back pain 07/29/2015   Obesity 04/29/2021   Smoker 01/30/2020   Tinea pedis 07/29/2015    History reviewed. No pertinent surgical history.  There were no vitals filed for this visit.    Subjective Assessment - 07/01/21 1103     Subjective Pt states he woke up 2 months ago and had Lt scapular pain. Pain has persisted despite use of OTC pain meds and modalities. Pt went to MD who recommends PT. Pt states pain increases during hot weather and with increased activity, pain eases with meds.    Pertinent History HIV    Patient Stated Goals decrease pain    Currently in Pain? Yes    Pain Score 9     Pain Location Scapula    Pain Orientation Left    Pain Type Acute pain    Pain Onset More than a month ago    Pain Frequency Constant    Aggravating Factors  increased activity    Pain Relieving Factors meds                [facility] PT Assessment - 07/01/21 0001       Assessment   Medical Diagnosis muscle strain, thoracic radiculopathy    Referring  Provider (PT) Gale Journey, Trung    Onset Date/Surgical Date 04/29/21    Hand Dominance Right      Precautions   Precautions None      Balance Screen   Has the patient fallen in the past 6 months No      Prior Function   Level of Independence Independent      Observation/Other Assessments   Focus on Therapeutic Outcomes (FOTO)  58      ROM / Strength   AROM / PROM / Strength AROM;Strength      AROM   Overall AROM Comments bilat shoulder WFL, cervical WFL, pain with cervical sidebending and rotation      Strength   Strength Assessment Site Shoulder    Right/Left Shoulder Right;Left    Right Shoulder Flexion 4+/5    Right Shoulder ABduction 4+/5    Left Shoulder Flexion 3/5   pain   Left Shoulder Extension 3/5   pain   Left Shoulder ABduction 3/5   pain     Palpation   Spinal mobility rib mobility WFL on Lt, scapular mobility WFL on Lt    Palpation comment TTP Lt upper  trap, rhomboids, levator, middle trap, lats, pecs                        Objective measurements completed on examination: See above findings.       [facility] Adult PT Treatment/Exercise - 07/01/21 0001       Exercises   Exercises Shoulder      Shoulder Exercises: Standing   Horizontal ABduction Both;10 reps    External Rotation 10 reps;Both    External Rotation Limitations bilat ER with focus on form to reduce compensation      Shoulder Exercises: Stretch   Other Shoulder Stretches doorway stretch 90 and 60 degrees x 30 sec each      Modalities   Modalities Electrical Stimulation;Moist Heat      Moist Heat Therapy   Number Minutes Moist Heat 10 Minutes    Moist Heat Location Shoulder      Electrical Stimulation   Electrical Stimulation Location left scapula    Electrical Stimulation Action TENS    Electrical Stimulation Parameters to tolerance    Electrical Stimulation Goals Pain                     PT Education - 07/01/21 1129     Education Details PT POC and goals,  HEP    Person(s) Educated Patient    Methods Explanation;Demonstration;Handout    Comprehension Returned demonstration;Verbalized understanding                 PT Long Term Goals - 07/01/21 1154       PT LONG TERM GOAL #1   Title Pt will be independent for HEP    Time 6    Period Weeks    Status New    Target Date 08/12/21      PT LONG TERM GOAL #2   Title Pt will improve FOTO to >= 74 to demo improved functional mobility    Time 6    Period Weeks    Status New    Target Date 08/12/21      PT LONG TERM GOAL #3   Title Pt will improve Lt UE strength to 4+/5 to improve tolerance to recreational activities    Time 6    Period Weeks    Status New    Target Date 08/12/21      PT LONG TERM GOAL #4   Title Pt will reduce pain with ADLs and IADLs to <= 2/10    Time 6    Period Weeks    Status New    Target Date 08/12/21                    Plan - 07/01/21 1150     Clinical Impression Statement Pt is a 50 y/o male referred for muscle strain and thoracic radiculopathy. Pt presents with decreased strength, increased pain, decreased functional activity tolerance and mobility and will benefit from skilled PT to assess deficits and improve functional mobility    Personal Factors and Comorbidities Past/Current Experience;Time since onset of injury/illness/exacerbation    Examination-Activity Limitations Lift;Reach Overhead;Sleep    Examination-Participation Restrictions Community Activity    Stability/Clinical Decision Making Stable/Uncomplicated    Clinical Decision Making Low    Rehab Potential Good    PT Frequency 2x / week    PT Duration 6 weeks    PT Treatment/Interventions Cryotherapy;Electrical Stimulation;Iontophoresis 24m/ml Dexamethasone;Moist Heat;Therapeutic exercise;Balance training;Neuromuscular re-education;Therapeutic activities;Vasopneumatic Device;Dry needling;Taping;Patient/family education;Manual techniques  PT Next Visit Plan assess HEP,  manual and modalities as needed, postural strength    PT Home Exercise Plan [id number]    Consulted and Agree with Plan of Care Patient             Patient will benefit from skilled therapeutic intervention in order to improve the following deficits and impairments:  Pain, Impaired UE functional use, Decreased strength, Decreased activity tolerance, Increased muscle spasms  Visit Diagnosis: Muscle weakness (generalized) - Plan: PT plan of care cert/re-cert  Acute left-sided thoracic back pain - Plan: PT plan of care cert/re-cert  Other symptoms and signs involving the musculoskeletal system - Plan: PT plan of care cert/re-cert     Problem List Patient Active Problem List   Diagnosis Date Noted   Obesity 04/29/2021   Smoker 01/30/2020   Blood per rectum 11/07/2018   Hearing loss, left  09/05/2017   Healthcare maintenance 09/05/2017   Blurry vision 02/03/2016   Lumbago 07/29/2015   Low testosterone 02/24/2015   HTN (hypertension) 02/24/2015   HIV disease ([place]) 02/24/2015   Gynecomastia 08/16/2014   Human immunodeficiency virus (HIV) disease ([place]) 11/08/2006  PHYSICAL THERAPY DISCHARGE SUMMARY  Visits from Start of Care: 1  Current functional level related to goals / functional outcomes: See above   Remaining deficits: See above   Education / Equipment: HEP   Patient agrees to discharge. Patient goals were not met. Patient is being discharged due to not returning since the last visit. Isabelle Course, PT,DPT10/19/223:00 PM   Isabelle Course, PT 07/01/2021, 12:11 PM  [facility] [place], [place], [address] Phone: [phone]   Fax:  [phone]  Name: [name] MRN: [id number] Date of Birth: May 20, 1971

## 2021-07-06 ENCOUNTER — Ambulatory Visit (HOSPITAL_COMMUNITY): Admission: RE | Admit: 2021-07-06 | Payer: Self-pay | Source: Ambulatory Visit

## 2021-07-08 ENCOUNTER — Encounter: Payer: Self-pay | Admitting: *Deleted

## 2021-07-08 ENCOUNTER — Encounter: Payer: Self-pay | Admitting: Physical Therapy

## 2021-07-15 ENCOUNTER — Encounter: Payer: Self-pay | Admitting: Physical Therapy

## 2021-07-16 ENCOUNTER — Ambulatory Visit: Payer: Self-pay | Admitting: Infectious Disease

## 2021-07-22 ENCOUNTER — Encounter: Payer: Self-pay | Admitting: Physical Therapy

## 2021-07-23 ENCOUNTER — Other Ambulatory Visit: Payer: Self-pay

## 2021-07-23 ENCOUNTER — Encounter (INDEPENDENT_AMBULATORY_CARE_PROVIDER_SITE_OTHER): Payer: Self-pay | Admitting: *Deleted

## 2021-07-23 ENCOUNTER — Encounter: Payer: Self-pay | Admitting: Infectious Disease

## 2021-07-23 ENCOUNTER — Ambulatory Visit (INDEPENDENT_AMBULATORY_CARE_PROVIDER_SITE_OTHER): Payer: Self-pay | Admitting: Infectious Disease

## 2021-07-23 VITALS — BP 146/96 | HR 75 | Temp 98.4°F | Wt 214.1 lb

## 2021-07-23 VITALS — BP 149/96 | HR 75 | Temp 98.6°F | Wt 214.0 lb

## 2021-07-23 DIAGNOSIS — B20 Human immunodeficiency virus [HIV] disease: Secondary | ICD-10-CM

## 2021-07-23 DIAGNOSIS — I10 Essential (primary) hypertension: Secondary | ICD-10-CM

## 2021-07-23 DIAGNOSIS — M549 Dorsalgia, unspecified: Secondary | ICD-10-CM

## 2021-07-23 DIAGNOSIS — M791 Myalgia, unspecified site: Secondary | ICD-10-CM

## 2021-07-23 DIAGNOSIS — Z006 Encounter for examination for normal comparison and control in clinical research program: Secondary | ICD-10-CM

## 2021-07-23 DIAGNOSIS — M545 Low back pain, unspecified: Secondary | ICD-10-CM

## 2021-07-23 DIAGNOSIS — M25512 Pain in left shoulder: Secondary | ICD-10-CM

## 2021-07-23 HISTORY — DX: Myalgia, unspecified site: M79.10

## 2021-07-23 MED ORDER — METHOCARBAMOL 500 MG PO TABS: 500.0000 mg | ORAL_TABLET | Freq: Four times a day (QID) | ORAL | 0 refills | Status: AC

## 2021-07-23 MED ORDER — TRAMADOL HCL 50 MG PO TABS: 50.0000 mg | ORAL_TABLET | Freq: Four times a day (QID) | ORAL | 0 refills | Status: AC | PRN

## 2021-07-23 NOTE — Progress Notes (Signed)
Subjective:  Chief complaint continuing to complain of upper back pain particular underneath his left shoulder and pain in his shoulder with movement   Patient ID: [name], male    DOB: Mar 10, 1971, 50 y.o.   MRN: [id number]  HPI  [name] saw me in July for follow-up for his HIV disease and hypertension on medications complaining of pain in his upper back and shoulder.   I noticed that the t visit for this he also c/o pain but more lower back pain and he received muscle relaxant from me at that visit which I also had offered this visit along with PT referral  He remains on Biktarvy and is suppressed.  He was seen by ophthalmology for his blurry vision and they told him he says that he just needs reading glasses though he lost his prescription.   At that visit I gave him a prescription for Robaxin long with referral to physical therapy.  He is in the interim seen Dr. Renold Don who renewed Robaxin and gave him Ultram.  He ordered an MRI of the left shoulder which is scheduled in a few days time in October.  The patient claims to have seen physical therapy though I have not seen any notes that of been sent back to [place].  He continues to complain of pain and asked for something to be given as an injection but we do not have an injectable pain medication here in the clinic.          Past Medical History:  Diagnosis Date   Blood per rectum 11/07/2018   Blurry vision 02/03/2016   Depression    Fever, unspecified 07/29/2015   HIV disease ([facility]) 02/24/2015   HIV infection ([facility])    Hypertension    Low testosterone 02/24/2015   Lower back pain 07/29/2015   Myalgia 07/23/2021   Obesity 04/29/2021   Smoker 01/30/2020   Tinea pedis 07/29/2015    No past surgical history on file.  No family history on file.    Social History   Socioeconomic History   Marital status: Legally Separated    Spouse name: Not on file   Number of children: Not on file   Years of education: Not on file    Highest education level: Not on file  Occupational History   Not on file  Tobacco Use   Smoking status: Every Day    Packs/day: 0.50    Types: Cigarettes   Smokeless tobacco: Never   Tobacco comments:    pt. not ready to quit smoking at this timeA1/2PPD  Substance and Sexual Activity   Alcohol use: Yes    Alcohol/week: 3.0 standard drinks    Types: 3 Standard drinks or equivalent per week    Comment: socially   Drug use: No   Sexual activity: Yes    Comment: declined condoms  Other Topics Concern   Not on file  Social History Narrative   Not on file   Social Determinants of Health   Financial Resource Strain: Not on file  Food Insecurity: Not on file  Transportation Needs: Not on file  Physical Activity: Not on file  Stress: Not on file  Social Connections: Not on file    No Known Allergies   Current Outpatient Medications:    bictegravir-emtricitabine-tenofovir AF (BIKTARVY) 50-200-25 MG TABS tablet, Take 1 tablet by mouth daily., Disp: 30 tablet, Rfl: 11   hydrochlorothiazide (HYDRODIURIL) 25 MG tablet, Take 1 tablet (25 mg total) by mouth daily., Disp:  30 tablet, Rfl: 11   valACYclovir (VALTREX) 1000 MG tablet, Take 1 tablet (1,000 mg total) by mouth daily., Disp: 30 tablet, Rfl: 5   methocarbamol (ROBAXIN) 500 MG tablet, Take 1 tablet (500 mg total) by mouth 4 (four) times daily., Disp: 120 tablet, Rfl: 0   traMADol (ULTRAM) 50 MG tablet, Take 1 tablet (50 mg total) by mouth every 6 (six) hours as needed for up to 7 days., Disp: 28 tablet, Rfl: 0  Review of Systems  Constitutional:  Negative for activity change, appetite change, chills, diaphoresis, fatigue, fever and unexpected weight change.  HENT:  Negative for congestion, rhinorrhea, sinus pressure, sneezing, sore throat and trouble swallowing.   Eyes:  Negative for photophobia and visual disturbance.  Respiratory:  Negative for cough, chest tightness, shortness of breath, wheezing and stridor.    Cardiovascular:  Negative for chest pain, palpitations and leg swelling.  Gastrointestinal:  Negative for abdominal distention, abdominal pain, anal bleeding, blood in stool, constipation, diarrhea, nausea and vomiting.  Genitourinary:  Negative for difficulty urinating, dysuria, flank pain and hematuria.  Musculoskeletal:  Positive for arthralgias and back pain. Negative for gait problem, joint swelling and myalgias.  Skin:  Negative for color change, pallor, rash and wound.  Neurological:  Negative for dizziness, tremors, weakness and light-headedness.  Hematological:  Negative for adenopathy. Does not bruise/bleed easily.  Psychiatric/Behavioral:  Positive for dysphoric mood. Negative for agitation, behavioral problems, confusion, decreased concentration and sleep disturbance.       Objective:   Physical Exam Constitutional:      Appearance: He is well-developed.  HENT:     Head: Normocephalic and atraumatic.  Eyes:     Conjunctiva/sclera: Conjunctivae normal.  Cardiovascular:     Rate and Rhythm: Normal rate and regular rhythm.  Pulmonary:     Effort: Pulmonary effort is normal. No respiratory distress.     Breath sounds: No wheezing.  Abdominal:     General: There is no distension.     Palpations: Abdomen is soft.  Musculoskeletal:     Left shoulder: Tenderness present. Decreased range of motion.     Cervical back: Normal range of motion and neck supple.     Thoracic back: Tenderness present.  Skin:    General: Skin is warm and dry.     Coloration: Skin is not pale.     Findings: No erythema or rash.  Neurological:     General: No focal deficit present.     Mental Status: He is alert and oriented to person, place, and time.  Psychiatric:        Mood and Affect: Mood normal.        Behavior: Behavior normal.        Thought Content: Thought content normal.        Judgment: Judgment normal.          Assessment & Plan:  Upper back pain and left shoulder pain:  I  am giving him 1 more prescription for Robaxin and 1 more prescription for tramadol but have told him he will no longer get any other controlled substances from this clinic for pain as is our policy.  I have referred him to sports medicine and also to internal medicine for management of his non-HIV related issues including his hypertension and now musculoskeletal pain.  Hypertension: Not optimally controlled pain could be a confounder.  Again referring him to internal medicine for primary care.   HIV disease has been well controlled on Biktarvy which I  am continuing I reviewed his viral load from July 10th 2022 which was not detected, with CD4 that was 985

## 2021-07-23 NOTE — Research (Signed)
[name] was here for his week 432 visit for A5321. He continues to complain of lt shoulder/back pain and is going to PT for it. His BP was a little high today and he said he took his BP med this morning. He is to see Dr. Daiva Eves after this visit. He will be returning in 11 months for the next visit.

## 2021-07-24 LAB — HEPATITIS C ANTIBODY
Hepatitis C Ab: NONREACTIVE
SIGNAL TO CUT-OFF: 0.01 (ref <1.00)

## 2021-07-24 LAB — CREATININE, SERUM: Creat: 0.81 mg/dL (ref 0.70–1.30)

## 2021-07-28 ENCOUNTER — Ambulatory Visit (HOSPITAL_COMMUNITY)
Admission: RE | Admit: 2021-07-28 | Discharge: 2021-07-28 | Disposition: A | Discharge status: Home / Self Care | Payer: Self-pay | Source: Ambulatory Visit | Attending: Internal Medicine | Admitting: Internal Medicine

## 2021-07-28 ENCOUNTER — Other Ambulatory Visit: Payer: Self-pay

## 2021-07-28 DIAGNOSIS — T148XXA Other injury of unspecified body region, initial encounter: Secondary | ICD-10-CM | POA: Insufficient documentation

## 2021-07-28 DIAGNOSIS — M5414 Radiculopathy, thoracic region: Secondary | ICD-10-CM | POA: Insufficient documentation

## 2021-08-05 ENCOUNTER — Ambulatory Visit (INDEPENDENT_AMBULATORY_CARE_PROVIDER_SITE_OTHER): Payer: Self-pay | Admitting: Sports Medicine

## 2021-08-05 VITALS — BP 128/94 | Ht 68.0 in | Wt 216.0 lb

## 2021-08-05 DIAGNOSIS — M545 Low back pain, unspecified: Secondary | ICD-10-CM

## 2021-08-05 DIAGNOSIS — G8929 Other chronic pain: Secondary | ICD-10-CM

## 2021-08-05 MED ORDER — METHYLPREDNISOLONE ACETATE 40 MG/ML IJ SUSP
40.0000 mg | Freq: Once | INTRAMUSCULAR | Status: AC
  Administered 2021-08-05: 40 mg via INTRA_ARTICULAR

## 2021-08-05 MED ORDER — MELOXICAM 15 MG PO TABS
15.0000 mg | ORAL_TABLET | Freq: Every day | ORAL | 0 refills | Status: DC
Start: 2021-08-05 — End: 2023-05-10

## 2021-08-05 NOTE — Patient Instructions (Signed)
It was great to meet you today, thank you for letting me participate in your care!  Today, we discussed your shoulder and back pain.  For your shoulder pain, we did a cortisone injection, this should kick in and start helping over the next few days.  If you are having pain, you may ice the area, and/or take Tylenol.  -Begin meloxicam (Mobic) 15mg , take 1 tablet daily with food for the next 7-10 days, then only as needed from there.  Do not take ibuprofen, Aleve or Motrin with this.  You may take Tylenol. -Begin home exercises, we will give you handouts and demonstrate these for you.  Do these at least once or twice daily -You may ice/heat the painful areas.  I do recommend a good stretching program for the low back as well.  Continue to work on core exercises at home.  You will follow-up in about 4 weeks if not improving.  If you have any further questions, please give the clinic a call [phone].  Cheers,  [phone], DO PGY-4, Sports Medicine Fellow [facility]

## 2021-08-05 NOTE — Progress Notes (Signed)
PCP: System, Provider Not In  Subjective:   HPI: Patient is a 50 y.o. male here for multiple ailments, including: right shoulder pain, left trapezius pain, and low back pain.   Patient states his pain started about 3-4 months ago, he denies any known injury or inciting event.  His pain is more so in the right shoulder and in the low back.  He is right-hand dominant.  He states when he reaches overhead or does a lot of work with his arm/shoulders he will get pain and stiffness.  The pain is located over the anterior lateral shoulder.  It will sometimes radiate into the proximal bicep area, but this denies any numbness tingling or radicular pain into the hands or fingers.  He does also have pain in the left posterior shoulder more over the trapezius area.  He states this feels tight and is a sharp nagging pain.  He denies any radicular pain down the left upper extremity.  He states at times he will get pain from the left trap into the left neck although denies any posterior neck pain.  Patient's back pain is in the low back bilaterally, although sometimes the right is worse than the left.  He denies any midline spinous process TTP.  He has been taking Aleve at times with only mild relief.  Both his shoulder and back pain is worse with activity such as bending and extending at the waist and overhead movements.  He denies any radicular pain down the posterior extremities.  He denies any bowel or bladder incontinence.  No fever or chills.  Had MRI of the thoracic spine by ? PCP on 07/28/21 which demonstrated: "No abnormality seen to explain the presenting symptoms. Very minimal desiccation and bulging of the discs at T2-3, T5-6 and T9-10, but without a large disc bulge or disc herniation. No stenosis of the canal or foramina. No evidence of facet arthropathy or costovertebral joint arthropathy."  Past Medical History:  Diagnosis Date   Blood per rectum 11/07/2018   Blurry vision 02/03/2016    Depression    Fever, unspecified 07/29/2015   HIV disease ([facility]) 02/24/2015   HIV infection ([facility])    Hypertension    Low testosterone 02/24/2015   Lower back pain 07/29/2015   Myalgia 07/23/2021   Obesity 04/29/2021   Smoker 01/30/2020   Tinea pedis 07/29/2015    Current Outpatient Medications on File Prior to Visit  Medication Sig Dispense Refill   bictegravir-emtricitabine-tenofovir AF (BIKTARVY) 50-200-25 MG TABS tablet Take 1 tablet by mouth daily. 30 tablet 11   hydrochlorothiazide (HYDRODIURIL) 25 MG tablet Take 1 tablet (25 mg total) by mouth daily. 30 tablet 11   methocarbamol (ROBAXIN) 500 MG tablet Take 1 tablet (500 mg total) by mouth 4 (four) times daily. 120 tablet 0   valACYclovir (VALTREX) 1000 MG tablet Take 1 tablet (1,000 mg total) by mouth daily. 30 tablet 5   No current facility-administered medications on file prior to visit.    No past surgical history on file.  No Known Allergies  BP (!) 128/94   Ht 5\' 8"  (1.727 m)   Wt 216 lb (98 kg)   BMI 32.84 kg/m       Objective:  Physical Exam:  Gen: Well-appearing, in no acute distress; non-toxic CV: Regular Rate. Well-perfused. Warm.  Resp: Breathing unlabored on room air; no wheezing. Psych: Fluid speech in conversation; appropriate affect; normal thought process Neuro: Sensation intact throughout. No gross coordination deficits.  MSK:  - Right  shoulder: + generalized TTP throughout shoulder but moreso over acromial area; no specific TTP over [facility] joint, scapula. Inspection yielded no erythema, ecchymosis, or swelling. Flexion is limited in active motion to approximately 110 degrees, but able to be taken to 170d passively. Painful arc around 90-100d abduction. + Painful resisted ext rotation. Strength 5/5 in all directions. Neurovascularly intact distally. Sensation to light touch intact t/o RUE. + Hawkin's impingement, + Jobe's test, + Gerber lift-off test.  - Left shoulder: + TTP and hypertonicity of left trapezius  muscle, moreso in mid muscle belly. No cervical TTP or paraspinal hypertonicity. FROM about the shoulder. 5/5 strength. - Low back: No midline SP TTP throughout lumbar spine. + TTP over b/l paraspinal muscles with associated hypertonicity b/l. No erythema, ecchymosis, or swelling. Full range of motion in flexion and extension, although some restriction in hyperextension. Negative modified slump's testing. + Mild loss of lumbar lorodosis upon palpation and inspection. 5/5 strength of b/l LE's with gross sensation to light touch throughout.      Assessment & Plan:  1. Right shoulder pain - chronic with some restriction in flexion and abduction, + impingement testing and supraspinatus provocative testing. Ddx most indicative of rotator cuff pathology.  2. Low back pain, chronic. Mechanical low back pain with associated paraspinal hypertonicity. Associated poor posture with loss of lumbar lorodosis and associated thoracic kyphosis. No red flag symptoms. 3. Left trapezius pain - hypertonicity noted on exam. Associated with poor posture with protracted b/l shoulders.   Procedure, Subacromial injection right shoulder: After informed written consent timeout was performed, patient was seated in chair in exam room. The patient's right shoulder was prepped with Betadine and alcohol swab the patient's subacromial space was injected with 4:1 lidocaine:depomedrol. Patient tolerated the procedure well without immediate complications.  Plan: - SAJ injection into right shoulder as above - Discussed importance of proper posture and ergonomics with working - HEP for low back pain and b/l shoulder pain with additional scapular retraction exercises - Will begin Mobic 15mg  to be taken schedule x 7 days, then as needed - Follow-up in 4 weeks - May consider additional imaging, +/- trigger point injections at future visits  , DO PGY-4, Sports Medicine Fellow [facility]   I was the  preceptor for this visit and available for immediate consultation [facility], DO

## 2021-08-25 ENCOUNTER — Encounter: Payer: Self-pay | Admitting: Infectious Disease

## 2021-09-04 ENCOUNTER — Other Ambulatory Visit: Payer: Self-pay | Admitting: Sports Medicine

## 2021-09-04 DIAGNOSIS — M545 Low back pain, unspecified: Secondary | ICD-10-CM

## 2021-11-06 ENCOUNTER — Ambulatory Visit: Payer: Self-pay | Admitting: Infectious Disease

## 2021-12-21 ENCOUNTER — Ambulatory Visit (INDEPENDENT_AMBULATORY_CARE_PROVIDER_SITE_OTHER): Payer: Self-pay

## 2021-12-21 ENCOUNTER — Encounter: Payer: Self-pay | Admitting: Infectious Disease

## 2021-12-21 ENCOUNTER — Other Ambulatory Visit: Payer: Self-pay

## 2021-12-21 ENCOUNTER — Ambulatory Visit (INDEPENDENT_AMBULATORY_CARE_PROVIDER_SITE_OTHER): Payer: Self-pay | Admitting: Infectious Disease

## 2021-12-21 VITALS — BP 171/95 | HR 85 | Temp 99.1°F | Wt 224.0 lb

## 2021-12-21 DIAGNOSIS — Z23 Encounter for immunization: Secondary | ICD-10-CM

## 2021-12-21 DIAGNOSIS — M542 Cervicalgia: Secondary | ICD-10-CM

## 2021-12-21 DIAGNOSIS — I10 Essential (primary) hypertension: Secondary | ICD-10-CM

## 2021-12-21 DIAGNOSIS — B20 Human immunodeficiency virus [HIV] disease: Secondary | ICD-10-CM

## 2021-12-21 DIAGNOSIS — Z7185 Encounter for immunization safety counseling: Secondary | ICD-10-CM

## 2021-12-21 HISTORY — DX: Cervicalgia: M54.2

## 2021-12-21 MED ORDER — HYDROCHLOROTHIAZIDE 25 MG PO TABS: 25.0000 mg | ORAL_TABLET | Freq: Every day | ORAL | 11 refills | Status: DC

## 2021-12-21 MED ORDER — BIKTARVY 50-200-25 MG PO TABS: 1.0000 | ORAL_TABLET | Freq: Every day | ORAL | 11 refills | Status: DC

## 2021-12-21 NOTE — Addendum Note (Signed)
Addended by: Marcell Anger on: 12/21/2021 03:10 PM ? ? Modules accepted: Orders ? ?

## 2021-12-21 NOTE — Progress Notes (Signed)
? ?  Covid-19 Vaccination Clinic ? ?Name:  Chavis Rossel    ?MRN: [id number] ?DOB: 03-May-1971 ? ?12/21/2021 ? ?Mr. Clausen was observed post Covid-19 immunization for 15 minutes without incident. He was provided with Vaccine Information Sheet and instruction to access the V-Safe system.  ? ?Mr. Monforte was instructed to call 911 with any severe reactions post vaccine: ?Difficulty breathing  ?Swelling of face and throat  ?A fast heartbeat  ?A bad rash all over body  ?Dizziness and weakness  ? ?Immunizations Administered   ? ? Name Date Dose VIS Date Route  ? Art gallery manager Booster 12/21/2021  3:15 PM 0.3 mL 06/17/2021 Intramuscular  ? Manufacturer: [employer]  ? Lot: [id number]  ? NDC: [phone]  ? ?  ? ?Frank Novelo P Bonnetta Allbee, CMA ? ?

## 2021-12-21 NOTE — Progress Notes (Signed)
? ?Subjective:  ?Chief complaint: neck pain on right side after straining a muscle ? ? Patient ID: [name], male    DOB: 03/10/1971, 51 y.o.   MRN: [id number] ? ?HPI ? ?[name] is a 51 year old African man  ([place] Citizen) who is living with HIV that has been perfectly controlled x >15 years. ? ?HIs HTN however has not and he claims that there were no refills for HCTZ ? ?He has been seen by sports medicine for his shoulder pain low back pain trapezius pain. ? ?Recently back in [place] and says he received yellow fever vaccine in [place]. ? ? ? ? ? ?Past Medical History:  ?Diagnosis Date  ? Blood per rectum 11/07/2018  ? Blurry vision 02/03/2016  ? Depression   ? Fever, unspecified 07/29/2015  ? HIV disease ([place]) 02/24/2015  ? HIV infection ([place])   ? Hypertension   ? Low testosterone 02/24/2015  ? Lower back pain 07/29/2015  ? Myalgia 07/23/2021  ? Neck pain 12/21/2021  ? Obesity 04/29/2021  ? Smoker 01/30/2020  ? Tinea pedis 07/29/2015  ? ? ?No past surgical history on file. ? ?No family history on file. ? ?  ?Social History  ? ?Socioeconomic History  ? Marital status: Legally Separated  ?  Spouse name: Not on file  ? Number of children: Not on file  ? Years of education: Not on file  ? Highest education level: Not on file  ?Occupational History  ? Not on file  ?Tobacco Use  ? Smoking status: Every Day  ?  Packs/day: 0.50  ?  Types: Cigarettes  ? Smokeless tobacco: Never  ? Tobacco comments:  ?  pt. not ready to quit smoking at this timeA1/2PPD  ?Substance and Sexual Activity  ? Alcohol use: Yes  ?  Alcohol/week: 3.0 standard drinks  ?  Types: 3 Standard drinks or equivalent per week  ?  Comment: socially  ? Drug use: No  ? Sexual activity: Yes  ?  Comment: declined condoms  ?Other Topics Concern  ? Not on file  ?Social History Narrative  ? Not on file  ? ?Social Determinants of Health  ? ?Financial Resource Strain: Not on file  ?Food Insecurity: Not on file  ?Transportation Needs: Not on file  ?Physical Activity: Not on file   ?Stress: Not on file  ?Social Connections: Not on file  ? ? ?No Known Allergies ? ? ?Current Outpatient Medications:  ?  bictegravir-emtricitabine-tenofovir AF (BIKTARVY) 50-200-25 MG TABS tablet, Take 1 tablet by mouth daily., Disp: 30 tablet, Rfl: 11 ?  hydrochlorothiazide (HYDRODIURIL) 25 MG tablet, Take 1 tablet (25 mg total) by mouth daily., Disp: 30 tablet, Rfl: 11 ?  meloxicam (MOBIC) 15 MG tablet, Take 1 tablet (15 mg total) by mouth daily. (Patient not taking: Reported on 12/21/2021), Disp: 30 tablet, Rfl: 0 ? ? ?Review of Systems  ?Constitutional:  Negative for activity change, appetite change, chills, diaphoresis, fatigue, fever and unexpected weight change.  ?HENT:  Negative for congestion, rhinorrhea, sinus pressure, sneezing, sore throat and trouble swallowing.   ?Eyes:  Negative for photophobia and visual disturbance.  ?Respiratory:  Negative for cough, chest tightness, shortness of breath, wheezing and stridor.   ?Cardiovascular:  Negative for chest pain, palpitations and leg swelling.  ?Gastrointestinal:  Negative for abdominal distention, abdominal pain, anal bleeding, blood in stool, constipation, diarrhea, nausea and vomiting.  ?Genitourinary:  Negative for difficulty urinating, dysuria, flank pain and hematuria.  ?Musculoskeletal:  Positive for myalgias and neck pain.  Negative for arthralgias, back pain, gait problem and joint swelling.  ?Skin:  Negative for color change, pallor, rash and wound.  ?Neurological:  Negative for dizziness, tremors, weakness and light-headedness.  ?Hematological:  Negative for adenopathy. Does not bruise/bleed easily.  ?Psychiatric/Behavioral:  Negative for agitation, behavioral problems, confusion, decreased concentration, dysphoric mood and sleep disturbance.   ? ?   ?Objective:  ? Physical Exam ?Constitutional:   ?   Appearance: He is well-developed.  ?HENT:  ?   Head: Normocephalic and atraumatic.  ?Eyes:  ?   Conjunctiva/sclera: Conjunctivae normal.   ?Cardiovascular:  ?   Rate and Rhythm: Normal rate and regular rhythm.  ?Pulmonary:  ?   Effort: Pulmonary effort is normal. No respiratory distress.  ?   Breath sounds: No wheezing.  ?Abdominal:  ?   General: There is no distension.  ?   Palpations: Abdomen is soft.  ?Musculoskeletal:     ?   General: No tenderness. Normal range of motion.  ?   Cervical back: Normal range of motion and neck supple. Pain with movement present.  ?Skin: ?   General: Skin is warm and dry.  ?   Coloration: Skin is not pale.  ?   Findings: No erythema or rash.  ?Neurological:  ?   General: No focal deficit present.  ?   Mental Status: He is alert and oriented to person, place, and time.  ?Psychiatric:     ?   Mood and Affect: Mood normal.     ?   Behavior: Behavior normal.     ?   Thought Content: Thought content normal.     ?   Judgment: Judgment normal.  ? ? ? ? ? ?   ?Assessment & Plan:  ? ?HIV disease: ? ?Check a viral load CD4 CBC CMP today. ? ?Continue his BIKTARVY was given him 28 days worth of pills to cover him in case there is any lapse obtain renewal of HMA P. ? ?Hypertension: poorly controlled I have sent ONE YEARS worth of HCTZ to pharmacy and I will ask him to come back in a month and we can recheck vitals at that time and review labs. ? ?Neck pain: recommended that he see sports medicine again and also consider PT ? ?Vaccine counseling: Commended updated COVID bivalent booster and Prevnar ?

## 2021-12-23 LAB — RPR: RPR Ser Ql: NONREACTIVE

## 2021-12-23 LAB — CBC WITH DIFFERENTIAL/PLATELET
Absolute Monocytes: 605 cells/uL (ref 200–950)
Basophils Absolute: 40 cells/uL (ref 0–200)
Basophils Relative: 0.8 %
Eosinophils Absolute: 100 cells/uL (ref 15–500)
Eosinophils Relative: 2 %
HCT: 39.2 % (ref 38.5–50.0)
Hemoglobin: 13.4 g/dL (ref 13.2–17.1)
Lymphs Abs: 2965 cells/uL (ref 850–3900)
MCH: 33.8 pg — ABNORMAL HIGH (ref 27.0–33.0)
MCHC: 34.2 g/dL (ref 32.0–36.0)
MCV: 98.7 fL (ref 80.0–100.0)
MPV: 9.8 fL (ref 7.5–12.5)
Monocytes Relative: 12.1 %
Neutro Abs: 1290 cells/uL — ABNORMAL LOW (ref 1500–7800)
Neutrophils Relative %: 25.8 %
Platelets: 291 10*3/uL (ref 140–400)
RBC: 3.97 10*6/uL — ABNORMAL LOW (ref 4.20–5.80)
RDW: 12.2 % (ref 11.0–15.0)
Total Lymphocyte: 59.3 %
WBC: 5 10*3/uL (ref 3.8–10.8)

## 2021-12-23 LAB — LIPID PANEL
Cholesterol: 164 mg/dL (ref <200)
HDL: 46 mg/dL (ref >=40)
LDL Cholesterol (Calc): 88 mg/dL (calc)
Non-HDL Cholesterol (Calc): 118 mg/dL (calc) (ref <130)
Total CHOL/HDL Ratio: 3.6 (calc) (ref <5.0)
Triglycerides: 210 mg/dL — ABNORMAL HIGH (ref <150)

## 2021-12-23 LAB — COMPLETE METABOLIC PANEL WITH GFR
AG Ratio: 1.4 (calc) (ref 1.0–2.5)
ALT: 20 U/L (ref 9–46)
AST: 21 U/L (ref 10–35)
Albumin: 3.9 g/dL (ref 3.6–5.1)
Alkaline phosphatase (APISO): 91 U/L (ref 35–144)
BUN: 12 mg/dL (ref 7–25)
CO2: 25 mmol/L (ref 20–32)
Calcium: 8.8 mg/dL (ref 8.6–10.3)
Chloride: 109 mmol/L (ref 98–110)
Creat: 0.86 mg/dL (ref 0.70–1.30)
Globulin: 2.7 g/dL (calc) (ref 1.9–3.7)
Glucose, Bld: 98 mg/dL (ref 65–99)
Potassium: 4.1 mmol/L (ref 3.5–5.3)
Sodium: 143 mmol/L (ref 135–146)
Total Bilirubin: 0.7 mg/dL (ref 0.2–1.2)
Total Protein: 6.6 g/dL (ref 6.1–8.1)
eGFR: 104 mL/min/{1.73_m2} (ref >=60)

## 2021-12-23 LAB — HIV-1 RNA QUANT-NO REFLEX-BLD
HIV 1 RNA Quant: <20 Copies/mL — ABNORMAL HIGH
HIV-1 RNA Quant, Log: <1.3 Log cps/mL — ABNORMAL HIGH

## 2021-12-23 LAB — C. TRACHOMATIS/N. GONORRHOEAE RNA
C. trachomatis RNA, TMA: NOT DETECTED
N. gonorrhoeae RNA, TMA: NOT DETECTED

## 2021-12-23 LAB — MICROALBUMIN / CREATININE URINE RATIO
Creatinine, Urine: 136 mg/dL (ref 20–320)
Microalb Creat Ratio: 10 mcg/mg creat (ref <30)
Microalb, Ur: 1.3 mg/dL

## 2021-12-23 LAB — T-HELPER CELLS (CD4) COUNT (NOT AT ARMC)
Absolute CD4: 1014 cells/uL (ref 490–1740)
CD4 T Helper %: 34 % (ref 30–61)
Total lymphocyte count: 3023 cells/uL (ref 850–3900)

## 2021-12-24 ENCOUNTER — Other Ambulatory Visit: Payer: Self-pay | Admitting: Pharmacist

## 2021-12-24 DIAGNOSIS — B20 Human immunodeficiency virus [HIV] disease: Secondary | ICD-10-CM

## 2021-12-24 MED ORDER — BICTEGRAVIR-EMTRICITAB-TENOFOV 50-200-25 MG PO TABS: 1.0000 | ORAL_TABLET | Freq: Every day | ORAL | 0 refills | Status: DC

## 2021-12-24 NOTE — Progress Notes (Signed)
Medication Samples have been provided to the patient. ? ?Drug name: Biktarvy        ?Strength: 50/200/25 mg       ?Qty: 28 tablets (4 bottles) ?LOT: [id number]   ?Exp.Date: 10/24 ? ?Dosing instructions: Take one tablet by mouth once daily ? ?The patient has been instructed regarding the correct time, dose, and frequency of taking this medication, including desired effects and most common side effects.  ? ?Pradeep Beaubrun, PharmD, CPP ?Clinical Pharmacist Practitioner ?Infectious Diseases Clinical Pharmacist ?[facility] ? ?

## 2022-01-20 ENCOUNTER — Ambulatory Visit: Payer: Self-pay | Admitting: Infectious Disease

## 2022-02-08 ENCOUNTER — Other Ambulatory Visit: Payer: Self-pay

## 2022-02-08 ENCOUNTER — Ambulatory Visit (INDEPENDENT_AMBULATORY_CARE_PROVIDER_SITE_OTHER): Payer: Self-pay | Admitting: Infectious Disease

## 2022-02-08 ENCOUNTER — Encounter: Payer: Self-pay | Admitting: Infectious Disease

## 2022-02-08 VITALS — BP 146/88 | HR 77 | Temp 98.2°F | Wt 223.0 lb

## 2022-02-08 DIAGNOSIS — F17200 Nicotine dependence, unspecified, uncomplicated: Secondary | ICD-10-CM

## 2022-02-08 DIAGNOSIS — I10 Essential (primary) hypertension: Secondary | ICD-10-CM

## 2022-02-08 DIAGNOSIS — E785 Hyperlipidemia, unspecified: Secondary | ICD-10-CM

## 2022-02-08 DIAGNOSIS — M546 Pain in thoracic spine: Secondary | ICD-10-CM

## 2022-02-08 DIAGNOSIS — B20 Human immunodeficiency virus [HIV] disease: Secondary | ICD-10-CM

## 2022-02-08 MED ORDER — PITAVASTATIN MAGNESIUM 4 MG PO TABS: 4.0000 mg | ORAL_TABLET | Freq: Every day | ORAL | 11 refills | Status: DC

## 2022-02-08 NOTE — Progress Notes (Signed)
? ?Subjective:  ?Chief complaint: Low back pain ? ? Patient ID: [name], male    DOB: 10-01-1971, 51 y.o.   MRN: [id number] ? ?HPI ? ?Dryden is a 51 year old African man  ([place] Citizen) who is living with HIV that has been perfectly controlled x >15 years. ? ?Not on antihypertensive medications which were restarted and asked him to come back to clinic on these. ? ?Blood pressure was better controlled today. ? ?He does continue to smoke cigarettes which have counseled him to quit. ? ?He is complaining again of pain down his back and I have redirected him to seek out evaluation with sports medicine. ? ? ? ? ? ? ?Past Medical History:  ?Diagnosis Date  ? Blood per rectum 11/07/2018  ? Blurry vision 02/03/2016  ? Depression   ? Fever, unspecified 07/29/2015  ? HIV disease ([facility]) 02/24/2015  ? HIV infection ([facility])   ? Hypertension   ? Low testosterone 02/24/2015  ? Lower back pain 07/29/2015  ? Myalgia 07/23/2021  ? Neck pain 12/21/2021  ? Obesity 04/29/2021  ? Smoker 01/30/2020  ? Tinea pedis 07/29/2015  ? ? ?No past surgical history on file. ? ?No family history on file. ? ?  ?Social History  ? ?Socioeconomic History  ? Marital status: Legally Separated  ?  Spouse name: Not on file  ? Number of children: Not on file  ? Years of education: Not on file  ? Highest education level: Not on file  ?Occupational History  ? Not on file  ?Tobacco Use  ? Smoking status: Every Day  ?  Packs/day: 0.50  ?  Types: Cigarettes  ? Smokeless tobacco: Never  ? Tobacco comments:  ?  pt. not ready to quit smoking at this timeA1/2PPD  ?Substance and Sexual Activity  ? Alcohol use: Yes  ?  Alcohol/week: 3.0 standard drinks  ?  Types: 3 Standard drinks or equivalent per week  ?  Comment: socially  ? Drug use: No  ? Sexual activity: Yes  ?  Comment: declined condoms  ?Other Topics Concern  ? Not on file  ?Social History Narrative  ? Not on file  ? ?Social Determinants of Health  ? ?Financial Resource Strain: Not on file  ?Food Insecurity: Not on file   ?Transportation Needs: Not on file  ?Physical Activity: Not on file  ?Stress: Not on file  ?Social Connections: Not on file  ? ? ?No Known Allergies ? ? ?Current Outpatient Medications:  ?  bictegravir-emtricitabine-tenofovir AF (BIKTARVY) 50-200-25 MG TABS tablet, Take 1 tablet by mouth daily., Disp: 30 tablet, Rfl: 11 ?  bictegravir-emtricitabine-tenofovir AF (BIKTARVY) 50-200-25 MG TABS tablet, Take 1 tablet by mouth daily for 28 days., Disp: 28 tablet, Rfl: 0 ?  hydrochlorothiazide (HYDRODIURIL) 25 MG tablet, Take 1 tablet (25 mg total) by mouth daily., Disp: 30 tablet, Rfl: 11 ?  meloxicam (MOBIC) 15 MG tablet, Take 1 tablet (15 mg total) by mouth daily. (Patient not taking: Reported on 12/21/2021), Disp: 30 tablet, Rfl: 0 ? ? ?Review of Systems  ?Constitutional:  Negative for activity change, appetite change, chills, diaphoresis, fatigue, fever and unexpected weight change.  ?HENT:  Negative for congestion, rhinorrhea, sinus pressure, sneezing, sore throat and trouble swallowing.   ?Eyes:  Negative for photophobia and visual disturbance.  ?Respiratory:  Negative for cough, chest tightness, shortness of breath, wheezing and stridor.   ?Cardiovascular:  Negative for chest pain, palpitations and leg swelling.  ?Gastrointestinal:  Negative for abdominal distention, abdominal pain,  anal bleeding, blood in stool, constipation, diarrhea, nausea and vomiting.  ?Genitourinary:  Negative for difficulty urinating, dysuria, flank pain and hematuria.  ?Musculoskeletal:  Positive for back pain and myalgias. Negative for arthralgias, gait problem and joint swelling.  ?Skin:  Negative for color change, pallor, rash and wound.  ?Neurological:  Negative for dizziness, tremors, weakness and light-headedness.  ?Hematological:  Negative for adenopathy. Does not bruise/bleed easily.  ?Psychiatric/Behavioral:  Negative for agitation, behavioral problems, confusion, decreased concentration, dysphoric mood and sleep disturbance.   ? ?    ?Objective:  ? Physical Exam ?Constitutional:   ?   Appearance: He is well-developed.  ?HENT:  ?   Head: Normocephalic and atraumatic.  ?Eyes:  ?   Conjunctiva/sclera: Conjunctivae normal.  ?Cardiovascular:  ?   Rate and Rhythm: Normal rate and regular rhythm.  ?Pulmonary:  ?   Effort: Pulmonary effort is normal. No respiratory distress.  ?   Breath sounds: No wheezing.  ?Abdominal:  ?   General: There is no distension.  ?   Palpations: Abdomen is soft.  ?Musculoskeletal:     ?   General: No tenderness. Normal range of motion.  ?   Cervical back: Normal range of motion and neck supple.  ?Skin: ?   General: Skin is warm and dry.  ?   Coloration: Skin is not pale.  ?   Findings: No erythema or rash.  ?Neurological:  ?   General: No focal deficit present.  ?   Mental Status: He is alert and oriented to person, place, and time.  ?Psychiatric:     ?   Mood and Affect: Mood normal.     ?   Behavior: Behavior normal.     ?   Thought Content: Thought content normal.     ?   Judgment: Judgment normal.  ? ? ? ? ? ?   ?Assessment & Plan:  ? ?HIV disease: ? ?I reviewed his viral load from March which was less than 20 i.e. undetectable and CD4 count which was thousand and 14. ? ?Cardiovascular prevention I am prescribing [place] statin which have sent into [facility] on [address] is given the recent data from [facility]. ? ?He also has additional risk factors besides his HIV of smoking and hypertension and age. ? ?Smoking counseled him to stop I have offered different ways that we could do this but he thinks he can do it simply by stopping through [facility]. ? ?Hypertension better controlled we will continue hydrochlorothiazide. ? ?Back pain: Referred him to go back to sports medicine and or PT ?

## 2022-02-17 ENCOUNTER — Encounter (HOSPITAL_COMMUNITY): Payer: Self-pay

## 2022-02-17 ENCOUNTER — Emergency Department (HOSPITAL_COMMUNITY)
Admission: EM | Admit: 2022-02-17 | Discharge: 2022-02-17 | Disposition: A | Discharge status: Home / Self Care | Payer: Self-pay | Attending: Emergency Medicine | Admitting: Emergency Medicine

## 2022-02-17 ENCOUNTER — Emergency Department (HOSPITAL_COMMUNITY): Payer: Self-pay

## 2022-02-17 DIAGNOSIS — K08109 Complete loss of teeth, unspecified cause, unspecified class: Secondary | ICD-10-CM | POA: Insufficient documentation

## 2022-02-17 DIAGNOSIS — S299XXA Unspecified injury of thorax, initial encounter: Secondary | ICD-10-CM | POA: Insufficient documentation

## 2022-02-17 DIAGNOSIS — S0993XA Unspecified injury of face, initial encounter: Secondary | ICD-10-CM | POA: Insufficient documentation

## 2022-02-17 DIAGNOSIS — S199XXA Unspecified injury of neck, initial encounter: Secondary | ICD-10-CM | POA: Insufficient documentation

## 2022-02-17 DIAGNOSIS — S0990XA Unspecified injury of head, initial encounter: Secondary | ICD-10-CM | POA: Insufficient documentation

## 2022-02-17 LAB — I-STAT CHEM 8, ED
BUN: 12 mg/dL (ref 6–20)
Calcium, Ion: 1.09 mmol/L — ABNORMAL LOW (ref 1.15–1.40)
Chloride: 102 mmol/L (ref 98–111)
Creatinine, Ser: 1 mg/dL (ref 0.61–1.24)
Glucose, Bld: 127 mg/dL — ABNORMAL HIGH (ref 70–99)
HCT: 44 % (ref 39.0–52.0)
Hemoglobin: 15 g/dL (ref 13.0–17.0)
Potassium: 2.9 mmol/L — ABNORMAL LOW (ref 3.5–5.1)
Sodium: 140 mmol/L (ref 135–145)
TCO2: 25 mmol/L (ref 22–32)

## 2022-02-17 MED ORDER — AMOXICILLIN 500 MG PO CAPS: 500.0000 mg | ORAL_CAPSULE | Freq: Three times a day (TID) | ORAL | 0 refills | Status: AC

## 2022-02-17 MED ORDER — LIDOCAINE-EPINEPHRINE 1 %-1:100000 IJ SOLN
10.0000 mL | Freq: Once | INTRAMUSCULAR | Status: AC
  Administered 2022-02-17: 10 mL via INTRADERMAL
  Filled 2022-02-17: qty 1
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
## 2022-02-17 NOTE — ED Notes (Signed)
Pt verbalized understanding of discharge paperwork, prescription and follow-up care.  
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
## 2022-02-17 NOTE — Discharge Instructions (Signed)
It was a pleasure taking care of you today.  We obtained imaging of your head and found no abnormal findings other than bruising and your missing teeth.  Regarding your tooth we tried to replace it but could not get it to stay because there is part of the root that is still in the socket.  You need to see a dentist.  Please call Dr. Marissa Calamity office to schedule an appointment for this.  For pain you can take Tylenol or ibuprofen.  If you notice any worsening of your pain, changes in vision, or weakness please be reevaluated.  I hope you have a wonderful afternoon! ? ?

## 2022-02-17 NOTE — ED Notes (Signed)
Pt's tooth exchanged into dental solution per PA in triage.  ?

## 2022-02-17 NOTE — ED Notes (Signed)
Pt's tooth placed in milk bath and given to pt.  ?

## 2022-02-18 ENCOUNTER — Telehealth: Payer: Self-pay

## 2022-02-18 NOTE — Telephone Encounter (Signed)
Left voicemail asking patient to return my call. Spoke to [place] with [facility] - she is unsure if dentist could fix dental issue - dentist would have to see patient first to determine. Joycelyn Schmid offered patient appointment on Monday 5/8 at 1:30 PM - will offer appointment when patient returns my call.  ? ? ?Michelina Mexicano P Galilea [place], CMA ? ?

## 2022-04-12 ENCOUNTER — Ambulatory Visit: Payer: Self-pay | Admitting: Infectious Disease

## 2022-04-12 ENCOUNTER — Telehealth: Payer: Self-pay

## 2022-06-09 ENCOUNTER — Encounter (INDEPENDENT_AMBULATORY_CARE_PROVIDER_SITE_OTHER): Payer: Self-pay | Admitting: *Deleted

## 2022-06-09 ENCOUNTER — Other Ambulatory Visit: Payer: Self-pay

## 2022-06-09 ENCOUNTER — Ambulatory Visit: Payer: Self-pay

## 2022-06-09 VITALS — BP 143/88 | HR 74 | Temp 98.5°F | Wt 222.6 lb

## 2022-06-09 DIAGNOSIS — Z006 Encounter for examination for normal comparison and control in clinical research program: Secondary | ICD-10-CM

## 2022-06-09 NOTE — Research (Signed)
[name] was here for his annual visit for [phone]. He is complaining of moderate back pain to his rt. Flank, He says it gets better with aleve and walking around. He denies any other new problems. He is interested in talking with Dr. Daiva Eves about cabaneuva and will see him in September.

## 2022-06-10 LAB — HEPATITIS C ANTIBODY: Hepatitis C Ab: NONREACTIVE

## 2022-06-10 LAB — CREATININE, SERUM: Creat: 0.94 mg/dL (ref 0.70–1.30)

## 2022-06-23 ENCOUNTER — Other Ambulatory Visit: Payer: Self-pay

## 2022-06-23 ENCOUNTER — Ambulatory Visit (INDEPENDENT_AMBULATORY_CARE_PROVIDER_SITE_OTHER): Payer: Self-pay | Admitting: Infectious Disease

## 2022-06-23 ENCOUNTER — Encounter: Payer: Self-pay | Admitting: Infectious Disease

## 2022-06-23 VITALS — BP 127/85 | HR 81 | Temp 98.0°F | Resp 16 | Ht 68.0 in | Wt 221.6 lb

## 2022-06-23 DIAGNOSIS — M5431 Sciatica, right side: Secondary | ICD-10-CM

## 2022-06-23 DIAGNOSIS — I10 Essential (primary) hypertension: Secondary | ICD-10-CM

## 2022-06-23 DIAGNOSIS — S025XXB Fracture of tooth (traumatic), initial encounter for open fracture: Secondary | ICD-10-CM

## 2022-06-23 DIAGNOSIS — M545 Low back pain, unspecified: Secondary | ICD-10-CM

## 2022-06-23 DIAGNOSIS — S025XXA Fracture of tooth (traumatic), initial encounter for closed fracture: Secondary | ICD-10-CM

## 2022-06-23 DIAGNOSIS — B20 Human immunodeficiency virus [HIV] disease: Secondary | ICD-10-CM

## 2022-06-23 DIAGNOSIS — E785 Hyperlipidemia, unspecified: Secondary | ICD-10-CM | POA: Insufficient documentation

## 2022-06-23 HISTORY — DX: Fracture of tooth (traumatic), initial encounter for closed fracture: S02.5XXA

## 2022-06-23 HISTORY — DX: Hyperlipidemia, unspecified: E78.5

## 2022-06-23 MED ORDER — BIKTARVY 50-200-25 MG PO TABS: 1.0000 | ORAL_TABLET | Freq: Every day | ORAL | 11 refills | Status: DC

## 2022-06-23 MED ORDER — PITAVASTATIN MAGNESIUM 4 MG PO TABS: 4.0000 mg | ORAL_TABLET | Freq: Every day | ORAL | 11 refills | Status: DC

## 2022-06-23 MED ORDER — HYDROCHLOROTHIAZIDE 25 MG PO TABS: 25.0000 mg | ORAL_TABLET | Freq: Every day | ORAL | 11 refills | Status: DC

## 2022-06-23 NOTE — Patient Instructions (Signed)
Call [facility]   [phone] for appt for your back pain
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
## 2022-06-24 ENCOUNTER — Telehealth: Payer: Self-pay | Admitting: Infectious Disease

## 2022-06-24 LAB — T-HELPER CELLS (CD4) COUNT (NOT AT ARMC)
CD4 % Helper T Cell: 30 % — ABNORMAL LOW (ref 33–65)
CD4 T Cell Abs: 901 /uL (ref 400–1790)

## 2022-06-24 NOTE — Telephone Encounter (Signed)
Spoke with patient. Appointment scheduled on 07/07/22, per patient preference.  Wyvonne Lenz, RN

## 2022-06-24 NOTE — Telephone Encounter (Signed)
[name] is running a little bit low. I wonder if he is getting enough potassium in his diet? We could also bring him in and add an ACEI to his blood pressure medicine or make a combination HCTZ, ACEI. Can we bring him in a few weeks to see me

## 2022-06-25 LAB — LIPID PANEL
Cholesterol: 180 mg/dL (ref <200)
HDL: 61 mg/dL (ref >=40)
LDL Cholesterol (Calc): 100 mg/dL (calc) — ABNORMAL HIGH
Non-HDL Cholesterol (Calc): 119 mg/dL (calc) (ref <130)
Total CHOL/HDL Ratio: 3 (calc) (ref <5.0)
Triglycerides: 99 mg/dL (ref <150)

## 2022-06-25 LAB — COMPLETE METABOLIC PANEL WITH GFR
AG Ratio: 1.3 (calc) (ref 1.0–2.5)
ALT: 34 U/L (ref 9–46)
AST: 33 U/L (ref 10–35)
Albumin: 4 g/dL (ref 3.6–5.1)
Alkaline phosphatase (APISO): 102 U/L (ref 35–144)
BUN: 14 mg/dL (ref 7–25)
CO2: 24 mmol/L (ref 20–32)
Calcium: 8.9 mg/dL (ref 8.6–10.3)
Chloride: 103 mmol/L (ref 98–110)
Creat: 0.83 mg/dL (ref 0.70–1.30)
Globulin: 3 g/dL (calc) (ref 1.9–3.7)
Glucose, Bld: 89 mg/dL (ref 65–99)
Potassium: 3.1 mmol/L — ABNORMAL LOW (ref 3.5–5.3)
Sodium: 140 mmol/L (ref 135–146)
Total Bilirubin: 0.7 mg/dL (ref 0.2–1.2)
Total Protein: 7 g/dL (ref 6.1–8.1)
eGFR: 105 mL/min/{1.73_m2} (ref >=60)

## 2022-06-25 LAB — CBC WITH DIFFERENTIAL/PLATELET
Absolute Monocytes: 517 cells/uL (ref 200–950)
Basophils Absolute: 50 cells/uL (ref 0–200)
Basophils Relative: 0.9 %
Eosinophils Absolute: 77 cells/uL (ref 15–500)
Eosinophils Relative: 1.4 %
HCT: 38.9 % (ref 38.5–50.0)
Hemoglobin: 13.9 g/dL (ref 13.2–17.1)
Lymphs Abs: 3119 cells/uL (ref 850–3900)
MCH: 34.9 pg — ABNORMAL HIGH (ref 27.0–33.0)
MCHC: 35.7 g/dL (ref 32.0–36.0)
MCV: 97.7 fL (ref 80.0–100.0)
MPV: 9.6 fL (ref 7.5–12.5)
Monocytes Relative: 9.4 %
Neutro Abs: 1738 cells/uL (ref 1500–7800)
Neutrophils Relative %: 31.6 %
Platelets: 235 10*3/uL (ref 140–400)
RBC: 3.98 10*6/uL — ABNORMAL LOW (ref 4.20–5.80)
RDW: 12.6 % (ref 11.0–15.0)
Total Lymphocyte: 56.7 %
WBC: 5.5 10*3/uL (ref 3.8–10.8)

## 2022-06-25 LAB — HIV-1 RNA QUANT-NO REFLEX-BLD
HIV 1 RNA Quant: NOT DETECTED Copies/mL
HIV-1 RNA Quant, Log: NOT DETECTED Log cps/mL

## 2022-06-25 LAB — RPR: RPR Ser Ql: NONREACTIVE

## 2022-06-28 LAB — URINE CYTOLOGY ANCILLARY ONLY
Chlamydia: NEGATIVE
Comment: NEGATIVE
Comment: NORMAL
Neisseria Gonorrhea: NEGATIVE

## 2022-07-01 ENCOUNTER — Encounter: Payer: Self-pay | Admitting: Infectious Disease

## 2022-07-07 ENCOUNTER — Ambulatory Visit (INDEPENDENT_AMBULATORY_CARE_PROVIDER_SITE_OTHER): Payer: Self-pay | Admitting: Infectious Disease

## 2022-07-07 ENCOUNTER — Encounter: Payer: Self-pay | Admitting: Infectious Disease

## 2022-07-07 ENCOUNTER — Other Ambulatory Visit: Payer: Self-pay

## 2022-07-07 VITALS — BP 159/93 | HR 79 | Temp 98.3°F | Ht 68.0 in | Wt 222.0 lb

## 2022-07-07 DIAGNOSIS — M5440 Lumbago with sciatica, unspecified side: Secondary | ICD-10-CM

## 2022-07-07 DIAGNOSIS — G44039 Episodic paroxysmal hemicrania, not intractable: Secondary | ICD-10-CM

## 2022-07-07 DIAGNOSIS — J302 Other seasonal allergic rhinitis: Secondary | ICD-10-CM

## 2022-07-07 DIAGNOSIS — E782 Mixed hyperlipidemia: Secondary | ICD-10-CM

## 2022-07-07 DIAGNOSIS — R519 Headache, unspecified: Secondary | ICD-10-CM | POA: Insufficient documentation

## 2022-07-07 DIAGNOSIS — B20 Human immunodeficiency virus [HIV] disease: Secondary | ICD-10-CM

## 2022-07-07 DIAGNOSIS — Z7185 Encounter for immunization safety counseling: Secondary | ICD-10-CM

## 2022-07-07 DIAGNOSIS — I10 Essential (primary) hypertension: Secondary | ICD-10-CM

## 2022-07-07 HISTORY — DX: Other seasonal allergic rhinitis: J30.2

## 2022-07-07 HISTORY — DX: Headache, unspecified: R51.9

## 2022-07-07 HISTORY — DX: Encounter for immunization safety counseling: Z71.85

## 2022-07-07 MED ORDER — LISINOPRIL-HYDROCHLOROTHIAZIDE 20-25 MG PO TABS: 1.0000 | ORAL_TABLET | Freq: Every day | ORAL | 11 refills | Status: DC

## 2022-07-07 NOTE — Progress Notes (Signed)
Subjective:  Chief complaint: Headaches also low back pain and some sinus congestion sciatica  Patient ID: [name], male    DOB: [birth year], 51 y.o.   MRN: [id number]  HPI  [name] is a 51 year old African man  ([place] Citizen) who is living with HIV that has been perfectly controlled x >15 years, currently on Biktarvy.   He had some interesting [place] that he voiced to [name] at a research visit.  We reviewed Cabenuva pros and cons today extensively and he ultimately decided he wanted to remain on pills.    The biggest reason he was interested in a long-acting therapy is that he likes to go to [place] and he has not been able to be given more than a month of supplies of antiretrovirals when he leaves at a time.  He likes to stay in [place] for 2 to 3 months.  While Cabenuva could accommodate a shorter stay than 2 months its not going to work well for a stay of greater than 2 months or 3 months and its current formulation and dosing regimen.  Blood pressure is up again I brought him back today to try to add an ACE inhibitor into his regimen.  He does continue to have lower back pain       Past Medical History:  Diagnosis Date   Blood per rectum 11/07/2018   Blurry vision 02/03/2016   Broken tooth 06/23/2022   Depression    Fever, unspecified 07/29/2015   HIV disease ([place]) 02/24/2015   HIV infection ([place])    Hyperlipidemia 06/23/2022   Hypertension    Low testosterone 02/24/2015   Lower back pain 07/29/2015   Myalgia 07/23/2021   Neck pain 12/21/2021   Obesity 04/29/2021   Smoker 01/30/2020   Tinea pedis 07/29/2015    No past surgical history on file.  No family history on file.    Social History   Socioeconomic History   Marital status: Legally Separated    Spouse name: Not on file   Number of children: Not on file   Years of education: Not on file   Highest education level: Not on file  Occupational History   Not on file  Tobacco Use   Smoking status: Every Day     Packs/day: 0.50    Types: Cigarettes   Smokeless tobacco: Never   Tobacco comments:    pt. not ready to quit smoking at this timeA1/2PPD  Substance and Sexual Activity   Alcohol use: Yes    Alcohol/week: 3.0 standard drinks of alcohol    Types: 3 Standard drinks or equivalent per week    Comment: socially   Drug use: No   Sexual activity: Yes    Comment: declined condoms  Other Topics Concern   Not on file  Social History Narrative   Not on file   Social Determinants of Health   Financial Resource Strain: Not on file  Food Insecurity: Not on file  Transportation Needs: Not on file  Physical Activity: Not on file  Stress: Not on file  Social Connections: Not on file    No Known Allergies   Current Outpatient Medications:    bictegravir-emtricitabine-tenofovir AF (BIKTARVY) 50-200-25 MG TABS tablet, Take 1 tablet by mouth daily., Disp: 30 tablet, Rfl: 11   hydrochlorothiazide (HYDRODIURIL) 25 MG tablet, Take 1 tablet (25 mg total) by mouth daily., Disp: 30 tablet, Rfl: 11   meloxicam (MOBIC) 15 MG tablet, Take 1 tablet (15 mg total) by mouth daily. (  Patient not taking: Reported on 12/21/2021), Disp: 30 tablet, Rfl: 0   Pitavastatin Magnesium 4 MG TABS, Take 4 mg by mouth daily., Disp: 30 tablet, Rfl: 11   Review of Systems  Constitutional:  Negative for activity change, appetite change, chills, diaphoresis, fatigue, fever and unexpected weight change.  HENT:  Negative for congestion, rhinorrhea, sinus pressure, sneezing, sore throat and trouble swallowing.   Eyes:  Negative for photophobia and visual disturbance.  Respiratory:  Negative for cough, chest tightness, shortness of breath, wheezing and stridor.   Cardiovascular:  Negative for chest pain, palpitations and leg swelling.  Gastrointestinal:  Negative for abdominal distention, abdominal pain, anal bleeding, blood in stool, constipation, diarrhea, nausea and vomiting.  Genitourinary:  Negative for difficulty urinating,  dysuria, flank pain and hematuria.  Musculoskeletal:  Positive for back pain. Negative for arthralgias, gait problem, joint swelling and myalgias.  Skin:  Negative for color change, pallor, rash and wound.  Allergic/Immunologic: Positive for environmental allergies.  Neurological:  Positive for headaches. Negative for dizziness, tremors, weakness and light-headedness.  Hematological:  Negative for adenopathy. Does not bruise/bleed easily.  Psychiatric/Behavioral:  Negative for agitation, behavioral problems, confusion, decreased concentration, dysphoric mood and sleep disturbance.        Objective:   Physical Exam Constitutional:      Appearance: He is well-developed.  HENT:     Head: Normocephalic and atraumatic.  Eyes:     Conjunctiva/sclera: Conjunctivae normal.  Cardiovascular:     Rate and Rhythm: Normal rate and regular rhythm.  Pulmonary:     Effort: Pulmonary effort is normal. No respiratory distress.     Breath sounds: No wheezing.  Abdominal:     General: There is no distension.     Palpations: Abdomen is soft.  Musculoskeletal:        General: No tenderness. Normal range of motion.     Cervical back: Normal range of motion and neck supple.  Skin:    General: Skin is warm and dry.     Coloration: Skin is not pale.     Findings: No erythema or rash.  Neurological:     General: No focal deficit present.     Mental Status: He is alert and oriented to person, place, and time.  Psychiatric:        Mood and Affect: Mood normal.        Behavior: Behavior normal.        Thought Content: Thought content normal.        Judgment: Judgment normal.           Assessment & Plan:   Hypertension: Blood pressure in clinic up again but certainly could have some degree of "white coat hypertension.  I think he would benefit from addition of an ACE inhibitor so I am going to give him a combination hydrochlorothiazide lisinopril prescription Zestoretic 25 mg of  hydrochlorothiazide side and 20 mg of lisinopril I will have him come back to clinic in a few weeks time to recheck his blood pressure and his BMP.  Hypokalemia should improve with ACE inhibitor being added.  HIV disease  Continue his Biktarvy.  If he does go to [place] I will try to secure some samples that he could have more than a month worth of medication to take with him to [place].  Vaccine counseling recommended updated COVID-19 and flu vaccine.  Back pain with sciatica: I have made recommendation to physical therapy.  Seasonal allergies and concern with exposure to mold in:  Would recommend nonsedating antihistamine Zyrtec.  Headaches: Likely due to his blood pressure not being as optimally controlled and would like him to have a home blood pressure cuff to monitor this with  Lipidemia we will continue his pitavastatin  I spent 41 minutes with the patient including than 50% of the time in face to face counseling of the patient guarding his current antiviral for regimen his interest in [place] pros and cons of this current long-acting therapy reviewing blood pressure goals palong with review of medical records in preparation for the visit and during the visit and in coordination of his care.

## 2022-07-28 ENCOUNTER — Other Ambulatory Visit: Payer: Self-pay

## 2022-07-28 ENCOUNTER — Encounter: Payer: Self-pay | Admitting: Infectious Disease

## 2022-07-28 ENCOUNTER — Ambulatory Visit (INDEPENDENT_AMBULATORY_CARE_PROVIDER_SITE_OTHER): Payer: Self-pay | Admitting: Infectious Disease

## 2022-07-28 VITALS — BP 130/85 | HR 77 | Temp 98.8°F | Ht 68.0 in | Wt 219.0 lb

## 2022-07-28 DIAGNOSIS — F17200 Nicotine dependence, unspecified, uncomplicated: Secondary | ICD-10-CM

## 2022-07-28 DIAGNOSIS — B20 Human immunodeficiency virus [HIV] disease: Secondary | ICD-10-CM

## 2022-07-28 DIAGNOSIS — E782 Mixed hyperlipidemia: Secondary | ICD-10-CM

## 2022-07-28 DIAGNOSIS — Z23 Encounter for immunization: Secondary | ICD-10-CM

## 2022-07-28 DIAGNOSIS — I10 Essential (primary) hypertension: Secondary | ICD-10-CM

## 2022-07-28 NOTE — Progress Notes (Signed)
Subjective:  Chief complaint: Follow-up for HIV disease on medications Patient ID: [name], male    DOB: 05-Jun-1971, 51 y.o.   MRN: [id number]  HPI  [name] is a 51 year old African man  ([place] Citizen) who is living with HIV that has been perfectly controlled x >15 years, currently on Biktarvy.   He had some interesting [place] that he voiced to Maudie Mercury at a research visit.  We reviewed Cabenuva pros and cons today extensively and he ultimately decided he wanted to remain on pills.    The biggest reason he was interested in a long-acting therapy is that he likes to go to [place] and he has not been able to be given more than a month of supplies of antiretrovirals when he leaves at a time.  He likes to stay in [place] for 2 to 3 months.  While Cabenuva could accommodate a shorter stay than 2 months its not going to work well for a stay of greater than 2 months or 3 months and its current formulation and dosing regimen.  Blood pressure is up again I brought him back today to try to add an ACE inhibitor into his regimen.   His blood pressure is indeed improved on this regimen.        Past Medical History:  Diagnosis Date   Blood per rectum 11/07/2018   Blurry vision 02/03/2016   Broken tooth 06/23/2022   Depression    Fever, unspecified 07/29/2015   Headache 07/07/2022   HIV disease ([place]) 02/24/2015   HIV infection ([place])    Hyperlipidemia 06/23/2022   Hypertension    Low testosterone 02/24/2015   Lower back pain 07/29/2015   Myalgia 07/23/2021   Neck pain 12/21/2021   Obesity 04/29/2021   Seasonal allergies 07/07/2022   Smoker 01/30/2020   Tinea pedis 07/29/2015   Vaccine counseling 07/07/2022    No past surgical history on file.  No family history on file.    Social History   Socioeconomic History   Marital status: Legally Separated    Spouse name: Not on file   Number of children: Not on file   Years of education: Not on file   Highest education level: Not on file   Occupational History   Not on file  Tobacco Use   Smoking status: Every Day    Packs/day: 0.25    Types: Cigarettes   Smokeless tobacco: Never   Tobacco comments:    pt. not ready to quit smoking at this timeA1/2PPD; cutting back  Substance and Sexual Activity   Alcohol use: Yes    Alcohol/week: 3.0 standard drinks of alcohol    Types: 3 Standard drinks or equivalent per week    Comment: socially   Drug use: No   Sexual activity: Yes    Comment: declined condoms  Other Topics Concern   Not on file  Social History Narrative   Not on file   Social Determinants of Health   Financial Resource Strain: Not on file  Food Insecurity: Not on file  Transportation Needs: Not on file  Physical Activity: Not on file  Stress: Not on file  Social Connections: Not on file    No Known Allergies   Current Outpatient Medications:    bictegravir-emtricitabine-tenofovir AF (BIKTARVY) 50-200-25 MG TABS tablet, Take 1 tablet by mouth daily., Disp: 30 tablet, Rfl: 11   lisinopril-hydrochlorothiazide (ZESTORETIC) 20-25 MG tablet, Take 1 tablet by mouth daily., Disp: 30 tablet, Rfl: 11   Pitavastatin Magnesium 4  MG TABS, Take 4 mg by mouth daily., Disp: 30 tablet, Rfl: 11   meloxicam (MOBIC) 15 MG tablet, Take 1 tablet (15 mg total) by mouth daily. (Patient not taking: Reported on 07/28/2022), Disp: 30 tablet, Rfl: 0   Review of Systems  Constitutional:  Negative for activity change, appetite change, chills, diaphoresis, fatigue, fever and unexpected weight change.  HENT:  Negative for congestion, rhinorrhea, sinus pressure, sneezing, sore throat and trouble swallowing.   Eyes:  Negative for photophobia and visual disturbance.  Respiratory:  Negative for cough, chest tightness, shortness of breath, wheezing and stridor.   Cardiovascular:  Negative for chest pain, palpitations and leg swelling.  Gastrointestinal:  Negative for abdominal distention, abdominal pain, anal bleeding, blood in  stool, constipation, diarrhea, nausea and vomiting.  Genitourinary:  Negative for difficulty urinating, dysuria, flank pain and hematuria.  Musculoskeletal:  Negative for arthralgias, back pain, gait problem, joint swelling and myalgias.  Skin:  Negative for color change, pallor, rash and wound.  Neurological:  Negative for dizziness, tremors, weakness and light-headedness.  Hematological:  Negative for adenopathy. Does not bruise/bleed easily.  Psychiatric/Behavioral:  Negative for agitation, behavioral problems, confusion, decreased concentration, dysphoric mood and sleep disturbance.        Objective:   Physical Exam Constitutional:      Appearance: He is well-developed.  HENT:     Head: Normocephalic and atraumatic.  Eyes:     Conjunctiva/sclera: Conjunctivae normal.  Cardiovascular:     Rate and Rhythm: Normal rate and regular rhythm.  Pulmonary:     Effort: Pulmonary effort is normal. No respiratory distress.     Breath sounds: No wheezing.  Abdominal:     General: There is no distension.     Palpations: Abdomen is soft.  Musculoskeletal:        General: No tenderness. Normal range of motion.     Cervical back: Normal range of motion and neck supple.  Skin:    General: Skin is warm and dry.     Coloration: Skin is not pale.     Findings: No erythema or rash.  Neurological:     General: No focal deficit present.     Mental Status: He is alert and oriented to person, place, and time.  Psychiatric:        Mood and Affect: Mood normal.        Behavior: Behavior normal.        Thought Content: Thought content normal.        Judgment: Judgment normal.           Assessment & Plan:   HIV disease:  I have reviewed [name]'s labs including viral load which was  Lab Results  Component Value Date   HIV1RNAQUANT Not Detected 06/23/2022   and cd4 which was  Lab Results  Component Value Date   CD4TABS 901 06/23/2022     I am continuing patient's prescription  for Biktarvy  When he nears trip to [place] will try to get "vacation override" to pain more meds and certainly use samples if needed  He does need to renew his assistance program in the beginning of 2024 ideally in January.  Hypertension: Much better controlled we will continue on his Zestoretic  Hyperlipidemia cardiovascular risk: We will continue his pitavastatin  Smoking: counseled re smoking cessation  Vaccine counseling: recommended flu shot (which he received) and updated COVID booster

## 2022-07-29 LAB — BASIC METABOLIC PANEL WITH GFR
BUN: 15 mg/dL (ref 7–25)
CO2: 27 mmol/L (ref 20–32)
Calcium: 9.2 mg/dL (ref 8.6–10.3)
Chloride: 101 mmol/L (ref 98–110)
Creat: 0.9 mg/dL (ref 0.70–1.30)
Glucose, Bld: 105 mg/dL — ABNORMAL HIGH (ref 65–99)
Potassium: 3.2 mmol/L — ABNORMAL LOW (ref 3.5–5.3)
Sodium: 140 mmol/L (ref 135–146)
eGFR: 102 mL/min/{1.73_m2} (ref >=60)

## 2022-11-30 ENCOUNTER — Ambulatory Visit (INDEPENDENT_AMBULATORY_CARE_PROVIDER_SITE_OTHER): Payer: Self-pay | Admitting: Infectious Disease

## 2022-11-30 ENCOUNTER — Other Ambulatory Visit: Payer: Self-pay

## 2022-11-30 ENCOUNTER — Encounter: Payer: Self-pay | Admitting: Infectious Disease

## 2022-11-30 ENCOUNTER — Ambulatory Visit (INDEPENDENT_AMBULATORY_CARE_PROVIDER_SITE_OTHER): Payer: Self-pay

## 2022-11-30 VITALS — BP 155/81 | HR 80 | Temp 98.4°F | Ht 68.0 in | Wt 224.0 lb

## 2022-11-30 DIAGNOSIS — F17200 Nicotine dependence, unspecified, uncomplicated: Secondary | ICD-10-CM

## 2022-11-30 DIAGNOSIS — I10 Essential (primary) hypertension: Secondary | ICD-10-CM

## 2022-11-30 DIAGNOSIS — B20 Human immunodeficiency virus [HIV] disease: Secondary | ICD-10-CM

## 2022-11-30 DIAGNOSIS — Z23 Encounter for immunization: Secondary | ICD-10-CM

## 2022-11-30 DIAGNOSIS — Z7185 Encounter for immunization safety counseling: Secondary | ICD-10-CM

## 2022-11-30 DIAGNOSIS — R0789 Other chest pain: Secondary | ICD-10-CM

## 2022-11-30 MED ORDER — BIKTARVY 50-200-25 MG PO TABS: 1.0000 | ORAL_TABLET | Freq: Every day | ORAL | 11 refills | Status: DC

## 2022-11-30 MED ORDER — PITAVASTATIN MAGNESIUM 4 MG PO TABS: 4.0000 mg | ORAL_TABLET | Freq: Every day | ORAL | 11 refills | Status: DC

## 2022-11-30 MED ORDER — LISINOPRIL-HYDROCHLOROTHIAZIDE 20-25 MG PO TABS: 1.0000 | ORAL_TABLET | Freq: Every day | ORAL | 11 refills | Status: DC

## 2022-11-30 NOTE — Progress Notes (Signed)
Subjective:  Chief complaint:follow-up for HIV disease on medications Also complaining of pain in his chest when he is hungry and some dyspnea on exertion    Patient ID: [name], male    DOB: [birth year], 52 y.o.   MRN: [id number]  HPI  [name] is a 52 year old African man  ([place] Citizen) who is living with HIV that has been perfectly controlled x >15 years, currently on Biktarvy.  He has been complaining of pain in his chest for the last several months when he is hungry and is happens on nearly a daily basis he also has had some dyspnea on exertion.  Blood pressure is poorly controlled in clinic and he appears to only be on hydrochlorothiazide rather than the hydrochlorothiazide/lisinopril combination we prescribed for him.            Past Medical History:  Diagnosis Date   Blood per rectum 11/07/2018   Blurry vision 02/03/2016   Broken tooth 06/23/2022   Depression    Fever, unspecified 07/29/2015   Headache 07/07/2022   HIV disease ([place]) 02/24/2015   HIV infection ([place])    Hyperlipidemia 06/23/2022   Hypertension    Low testosterone 02/24/2015   Lower back pain 07/29/2015   Myalgia 07/23/2021   Neck pain 12/21/2021   Obesity 04/29/2021   Seasonal allergies 07/07/2022   Smoker 01/30/2020   Tinea pedis 07/29/2015   Vaccine counseling 07/07/2022    No past surgical history on file.  No family history on file.    Social History   Socioeconomic History   Marital status: Legally Separated    Spouse name: Not on file   Number of children: Not on file   Years of education: Not on file   Highest education level: Not on file  Occupational History   Not on file  Tobacco Use   Smoking status: Every Day    Packs/day: 0.25    Types: Cigarettes   Smokeless tobacco: Never   Tobacco comments:    pt. not ready to quit smoking at this timeA1/2PPD; cutting back  Substance and Sexual Activity   Alcohol use: Yes    Alcohol/week: 3.0 standard drinks of alcohol    Types: 3  Standard drinks or equivalent per week    Comment: socially   Drug use: No   Sexual activity: Yes    Comment: declined condoms  Other Topics Concern   Not on file  Social History Narrative   Not on file   Social Determinants of Health   Financial Resource Strain: Not on file  Food Insecurity: Not on file  Transportation Needs: Not on file  Physical Activity: Not on file  Stress: Not on file  Social Connections: Not on file    No Known Allergies   Current Outpatient Medications:    bictegravir-emtricitabine-tenofovir AF (BIKTARVY) 50-200-25 MG TABS tablet, Take 1 tablet by mouth daily., Disp: 30 tablet, Rfl: 11   lisinopril-hydrochlorothiazide (ZESTORETIC) 20-25 MG tablet, Take 1 tablet by mouth daily., Disp: 30 tablet, Rfl: 11   meloxicam (MOBIC) 15 MG tablet, Take 1 tablet (15 mg total) by mouth daily. (Patient not taking: Reported on 07/28/2022), Disp: 30 tablet, Rfl: 0   Pitavastatin Magnesium 4 MG TABS, Take 4 mg by mouth daily., Disp: 30 tablet, Rfl: 11   Review of Systems  Constitutional:  Negative for activity change, appetite change, chills, diaphoresis, fatigue, fever and unexpected weight change.  HENT:  Negative for congestion, rhinorrhea, sinus pressure, sneezing, sore throat and trouble  swallowing.   Eyes:  Negative for photophobia and visual disturbance.  Respiratory:  Negative for cough, chest tightness, shortness of breath, wheezing and stridor.   Cardiovascular:  Negative for chest pain, palpitations and leg swelling.  Gastrointestinal:  Negative for abdominal distention, abdominal pain, anal bleeding, blood in stool, constipation, diarrhea, nausea and vomiting.  Genitourinary:  Negative for difficulty urinating, dysuria, flank pain and hematuria.  Musculoskeletal:  Negative for arthralgias, back pain, gait problem, joint swelling and myalgias.  Skin:  Negative for color change, pallor, rash and wound.  Neurological:  Negative for dizziness, tremors, weakness,  light-headedness and headaches.  Hematological:  Negative for adenopathy. Does not bruise/bleed easily.  Psychiatric/Behavioral:  Negative for agitation, behavioral problems, confusion, decreased concentration, dysphoric mood, sleep disturbance and suicidal ideas.        Objective:   Physical Exam Constitutional:      Appearance: He is well-developed.  HENT:     Head: Normocephalic and atraumatic.  Eyes:     Conjunctiva/sclera: Conjunctivae normal.  Cardiovascular:     Rate and Rhythm: Normal rate and regular rhythm.     Heart sounds: No murmur heard.    No friction rub. No gallop.  Pulmonary:     Effort: Pulmonary effort is normal. No respiratory distress.     Breath sounds: No stridor. No wheezing or rhonchi.  Abdominal:     General: There is no distension.     Palpations: Abdomen is soft.  Musculoskeletal:        General: No tenderness. Normal range of motion.     Cervical back: Normal range of motion and neck supple.  Skin:    General: Skin is warm and dry.     Coloration: Skin is not pale.     Findings: No erythema or rash.  Neurological:     General: No focal deficit present.     Mental Status: He is alert and oriented to person, place, and time.  Psychiatric:        Mood and Affect: Mood normal.        Behavior: Behavior normal.        Thought Content: Thought content normal.        Judgment: Judgment normal.           Assessment & Plan:    HIV disease:  I will add order HIV viral load CD4 count CBC with differential CMP, RPR GC and chlamydia and I will continue  Prentice Lonsway's Biktarvy,  prescription   Hypertension: Poorly controlled on initial blood pressure check 1 to make sure he is actually taking the combination Zestoretic pill  Hyperlipidemia he is continue on pitavastatin  Atypical chest pain: will refer to Cardiology and also make sure he gets on Medicaid  Smoking: counseled re smoking cessation  Vaccine counseling: recommended and updated  COVID booster

## 2022-12-01 LAB — URINE CYTOLOGY ANCILLARY ONLY
Chlamydia: NEGATIVE
Comment: NEGATIVE
Comment: NORMAL
Neisseria Gonorrhea: NEGATIVE

## 2022-12-01 LAB — T-HELPER CELLS (CD4) COUNT (NOT AT ARMC)
CD4 % Helper T Cell: 26 % — ABNORMAL LOW (ref 33–65)
CD4 T Cell Abs: 672 /uL (ref 400–1790)

## 2022-12-02 LAB — CBC WITH DIFFERENTIAL/PLATELET
Absolute Monocytes: 444 cells/uL (ref 200–950)
Basophils Absolute: 41 cells/uL (ref 0–200)
Basophils Relative: 0.8 %
Eosinophils Absolute: 112 cells/uL (ref 15–500)
Eosinophils Relative: 2.2 %
HCT: 41.3 % (ref 38.5–50.0)
Hemoglobin: 14.5 g/dL (ref 13.2–17.1)
Lymphs Abs: 2616 cells/uL (ref 850–3900)
MCH: 34.8 pg — ABNORMAL HIGH (ref 27.0–33.0)
MCHC: 35.1 g/dL (ref 32.0–36.0)
MCV: 99 fL (ref 80.0–100.0)
MPV: 9.4 fL (ref 7.5–12.5)
Monocytes Relative: 8.7 %
Neutro Abs: 1887 cells/uL (ref 1500–7800)
Neutrophils Relative %: 37 %
Platelets: 267 10*3/uL (ref 140–400)
RBC: 4.17 10*6/uL — ABNORMAL LOW (ref 4.20–5.80)
RDW: 11.7 % (ref 11.0–15.0)
Total Lymphocyte: 51.3 %
WBC: 5.1 10*3/uL (ref 3.8–10.8)

## 2022-12-02 LAB — COMPLETE METABOLIC PANEL WITH GFR
AG Ratio: 1.1 (calc) (ref 1.0–2.5)
ALT: 32 U/L (ref 9–46)
AST: 27 U/L (ref 10–35)
Albumin: 3.7 g/dL (ref 3.6–5.1)
Alkaline phosphatase (APISO): 84 U/L (ref 35–144)
BUN: 17 mg/dL (ref 7–25)
CO2: 24 mmol/L (ref 20–32)
Calcium: 8.8 mg/dL (ref 8.6–10.3)
Chloride: 105 mmol/L (ref 98–110)
Creat: 0.85 mg/dL (ref 0.70–1.30)
Globulin: 3.3 g/dL (calc) (ref 1.9–3.7)
Glucose, Bld: 100 mg/dL — ABNORMAL HIGH (ref 65–99)
Potassium: 3.6 mmol/L (ref 3.5–5.3)
Sodium: 140 mmol/L (ref 135–146)
Total Bilirubin: 0.4 mg/dL (ref 0.2–1.2)
Total Protein: 7 g/dL (ref 6.1–8.1)
eGFR: 104 mL/min/{1.73_m2} (ref >=60)

## 2022-12-02 LAB — LIPID PANEL
Cholesterol: 207 mg/dL — ABNORMAL HIGH (ref <200)
HDL: 63 mg/dL (ref >=40)
LDL Cholesterol (Calc): 114 mg/dL (calc) — ABNORMAL HIGH
Non-HDL Cholesterol (Calc): 144 mg/dL (calc) — ABNORMAL HIGH (ref <130)
Total CHOL/HDL Ratio: 3.3 (calc) (ref <5.0)
Triglycerides: 180 mg/dL — ABNORMAL HIGH (ref <150)

## 2022-12-02 LAB — HIV-1 RNA QUANT-NO REFLEX-BLD
HIV 1 RNA Quant: NOT DETECTED Copies/mL
HIV-1 RNA Quant, Log: NOT DETECTED Log cps/mL

## 2022-12-02 LAB — RPR: RPR Ser Ql: NONREACTIVE

## 2022-12-15 NOTE — Progress Notes (Unsigned)
Cardiology Office Note:    Date:  12/15/2022   ID:  [name], DOB 02/25/1971, MRN [id number]  PCP:  Tommy Medal, Lavell Islam, MD   [place] Providers Cardiologist:  None { Click to update primary MD,subspecialty MD or APP then REFRESH:1}    Referring MD: Tommy Medal, Lavell Islam, MD   No chief complaint on file. ***  History of Present Illness:    [name] is a 52 y.o. male with a hx of HIV, saw his infectious disease specialist and described CP  Past Medical History:  Diagnosis Date   Blood per rectum 11/07/2018   Blurry vision 02/03/2016   Broken tooth 06/23/2022   Depression    Fever, unspecified 07/29/2015   Headache 07/07/2022   HIV disease ([place]) 02/24/2015   HIV infection ([place])    Hyperlipidemia 06/23/2022   Hypertension    Low testosterone 02/24/2015   Lower back pain 07/29/2015   Myalgia 07/23/2021   Neck pain 12/21/2021   Obesity 04/29/2021   Seasonal allergies 07/07/2022   Smoker 01/30/2020   Tinea pedis 07/29/2015   Vaccine counseling 07/07/2022    No past surgical history on file.  Current Medications: No outpatient medications have been marked as taking for the 12/16/22 encounter (Appointment) with Janina Mayo, MD.     Allergies:   Patient has no known allergies.   Social History   Socioeconomic History   Marital status: Legally Separated    Spouse name: Not on file   Number of children: Not on file   Years of education: Not on file   Highest education level: Not on file  Occupational History   Not on file  Tobacco Use   Smoking status: Every Day    Packs/day: 0.25    Types: Cigarettes   Smokeless tobacco: Never   Tobacco comments:    pt. not ready to quit smoking at this timeA1/2PPD; cutting back  Substance and Sexual Activity   Alcohol use: Yes    Alcohol/week: 3.0 standard drinks of alcohol    Types: 3 Standard drinks or equivalent per week    Comment: socially   Drug use: No   Sexual activity: Yes    Comment: declined condoms   Other Topics Concern   Not on file  Social History Narrative   Not on file   Social Determinants of Health   Financial Resource Strain: Not on file  Food Insecurity: Not on file  Transportation Needs: Not on file  Physical Activity: Not on file  Stress: Not on file  Social Connections: Not on file     Family History: The patient's ***family history is not on file.  ROS:   Please see the history of present illness.    *** All other systems reviewed and are negative.  EKGs/Labs/Other Studies Reviewed:    The following studies were reviewed today: ***  EKG:  EKG is *** ordered today.  The ekg ordered today demonstrates ***  Recent Labs: 11/30/2022: ALT 32; BUN 17; Creat 0.85; Hemoglobin 14.5; Platelets 267; Potassium 3.6; Sodium 140  Recent Lipid Panel    Component Value Date/Time   CHOL 207 (H) 11/30/2022 1128   TRIG 180 (H) 11/30/2022 1128   HDL 63 11/30/2022 1128   CHOLHDL 3.3 11/30/2022 1128   VLDL 37 (H) 09/02/2016 0915   LDLCALC 114 (H) 11/30/2022 1128   LDLDIRECT 111 (H) 11/28/2012 0950     Risk Assessment/Calculations:   {Does this patient have ATRIAL FIBRILLATION?:[id number]}  No BP  recorded.  {Refresh Note OR Click here to enter BP  :1}***         Physical Exam:    VS:  There were no vitals taken for this visit.    Wt Readings from Last 3 Encounters:  11/30/22 224 lb (101.6 kg)  07/28/22 219 lb (99.3 kg)  07/07/22 222 lb (100.7 kg)     GEN: *** Well nourished, well developed in no acute distress HEENT: Normal NECK: No JVD; No carotid bruits LYMPHATICS: No lymphadenopathy CARDIAC: ***RRR, no murmurs, rubs, gallops RESPIRATORY:  Clear to auscultation without rales, wheezing or rhonchi  ABDOMEN: Soft, non-tender, non-distended MUSCULOSKELETAL:  No edema; No deformity  SKIN: Warm and dry NEUROLOGIC:  Alert and oriented x 3 PSYCHIATRIC:  Normal affect   ASSESSMENT:    CP: given HIV status , this increases his risk for CVD. Will plan for a  coronary CTA PLAN:    In order of problems listed above:  Coronary CTA      {Are you ordering a CV Procedure (e.g. stress test, cath, DCCV, TEE, etc)?   Press F2        :[id number]    Medication Adjustments/Labs and Tests Ordered: Current medicines are reviewed at length with the patient today.  Concerns regarding medicines are outlined above.  No orders of the defined types were placed in this encounter.  No orders of the defined types were placed in this encounter.   There are no Patient Instructions on file for this visit.   Signed, Janina Mayo, MD  12/15/2022 4:22 PM    [place]

## 2022-12-16 ENCOUNTER — Encounter: Payer: Self-pay | Admitting: Internal Medicine

## 2022-12-16 ENCOUNTER — Ambulatory Visit: Payer: Self-pay | Attending: Internal Medicine | Admitting: Internal Medicine

## 2022-12-16 VITALS — BP 148/64 | HR 85 | Ht 68.0 in | Wt 223.0 lb

## 2022-12-16 DIAGNOSIS — R079 Chest pain, unspecified: Secondary | ICD-10-CM

## 2022-12-16 MED ORDER — AMLODIPINE-OLMESARTAN 5-40 MG PO TABS: 1.0000 | ORAL_TABLET | Freq: Every day | ORAL | 3 refills | Status: DC

## 2022-12-16 NOTE — Patient Instructions (Signed)
Medication Instructions:   Start taking olmesartan -amlodipine 40 /5 mg daily    Stop taking  Lisinopril- Hydrochlorothiazide   Stop taking hydrochlorothiazide    *If you need a refill on your cardiac medications before your next appointment, please call your pharmacy*   Lab Work: Not needed    Testing/Procedures:  Your physician has requested that you have an exercise tolerance test. Please also follow instruction sheet, as given. This will take place at [facility] [address] Do not drink or eat foods with caffeine for 24 hours before the test. (Chocolate, coffee, tea, or energy drinks) If you use an inhaler, bring it with you to the test. Do not smoke for 4 hours before the test. Wear comfortable shoes and clothing.    Follow-Up: At [facility], you and your health needs are our priority.  As part of our continuing mission to provide you with exceptional heart care, we have created designated Provider Care Teams.  These Care Teams include your primary Cardiologist (physician) and Advanced Practice Providers (APPs -  Physician Assistants and Nurse Practitioners) who all work together to provide you with the care you need, when you need it.     Your next appointment:   3 month(s)  The format for your next appointment:   In Person  Provider:   Janina Mayo, MD    Other Instruction

## 2022-12-30 ENCOUNTER — Encounter (HOSPITAL_COMMUNITY): Payer: Self-pay | Admitting: *Deleted

## 2022-12-30 ENCOUNTER — Telehealth (HOSPITAL_COMMUNITY): Payer: Self-pay | Admitting: *Deleted

## 2022-12-30 NOTE — Telephone Encounter (Signed)
My Chart letter sent oulining instructions for upcoming ETT on 01/06/23 at 3:30.

## 2023-01-06 ENCOUNTER — Ambulatory Visit (HOSPITAL_COMMUNITY): Payer: Self-pay | Attending: Internal Medicine

## 2023-01-07 ENCOUNTER — Encounter (HOSPITAL_COMMUNITY): Payer: Self-pay | Admitting: Internal Medicine

## 2023-01-19 ENCOUNTER — Telehealth (HOSPITAL_BASED_OUTPATIENT_CLINIC_OR_DEPARTMENT_OTHER): Payer: Self-pay | Admitting: Licensed Clinical Social Worker

## 2023-01-19 NOTE — Telephone Encounter (Signed)
H&V Care Navigation CSW Progress Note  Clinical Social Worker contacted patient by phone to f/u on Medicaid- previously spoke with pt following appt with Dr Harl Bowie at [facility] briefly.  Was able to reach him today at [phone], re-introduced self, role, reason for call again. He had shared he had applied for Medicaid at that time and was supposed to be mailed papers but didn't think he received anything. NCTracks shows no updates regarding Medicaid application. Today I recommended he speak with caseworkers at [facility] since there hasn't been any more updates he has received from [facility]. LCSW will text him that information and their hours. LCSW remains available if any additional questions/concerns arise.   Patient is participating in a Managed Medicaid Plan:  No, self pay only.   SDOH Screenings   Depression (PHQ2-9): Low Risk  (11/30/2022)  Tobacco Use: High Risk (12/16/2022)    Westley Hummer, MSW, [place]  [phone]- work cell phone (preferred) [phone]- desk phone

## 2023-03-21 ENCOUNTER — Ambulatory Visit: Payer: Self-pay | Attending: Internal Medicine | Admitting: Internal Medicine

## 2023-05-08 ENCOUNTER — Emergency Department (HOSPITAL_COMMUNITY): Payer: Self-pay

## 2023-05-08 ENCOUNTER — Encounter (HOSPITAL_COMMUNITY): Payer: Self-pay

## 2023-05-08 ENCOUNTER — Other Ambulatory Visit: Payer: Self-pay

## 2023-05-08 ENCOUNTER — Emergency Department (HOSPITAL_COMMUNITY)
Admission: EM | Admit: 2023-05-08 | Discharge: 2023-05-08 | Disposition: A | Discharge status: Home / Self Care | Payer: Self-pay | Attending: Emergency Medicine | Admitting: Emergency Medicine

## 2023-05-08 DIAGNOSIS — M5441 Lumbago with sciatica, right side: Secondary | ICD-10-CM | POA: Insufficient documentation

## 2023-05-08 DIAGNOSIS — M79604 Pain in right leg: Secondary | ICD-10-CM | POA: Insufficient documentation

## 2023-05-08 DIAGNOSIS — R109 Unspecified abdominal pain: Secondary | ICD-10-CM | POA: Insufficient documentation

## 2023-05-08 LAB — I-STAT CHEM 8, ED
BUN: 15 mg/dL (ref 6–20)
Calcium, Ion: 1.05 mmol/L — ABNORMAL LOW (ref 1.15–1.40)
Chloride: 103 mmol/L (ref 98–111)
Creatinine, Ser: 0.8 mg/dL (ref 0.61–1.24)
Glucose, Bld: 119 mg/dL — ABNORMAL HIGH (ref 70–99)
HCT: 46 % (ref 39.0–52.0)
Hemoglobin: 15.6 g/dL (ref 13.0–17.0)
Potassium: 3.3 mmol/L — ABNORMAL LOW (ref 3.5–5.1)
Sodium: 139 mmol/L (ref 135–145)
TCO2: 26 mmol/L (ref 22–32)

## 2023-05-08 LAB — URINALYSIS, ROUTINE W REFLEX MICROSCOPIC
Bilirubin Urine: NEGATIVE
Glucose, UA: NEGATIVE mg/dL
Hgb urine dipstick: NEGATIVE
Ketones, ur: NEGATIVE mg/dL
Leukocytes,Ua: NEGATIVE
Nitrite: NEGATIVE
Protein, ur: NEGATIVE mg/dL
Specific Gravity, Urine: 1.019 (ref 1.005–1.030)
pH: 5 (ref 5.0–8.0)

## 2023-05-08 MED ORDER — PREDNISONE 50 MG PO TABS: ORAL_TABLET | ORAL | 0 refills | Status: DC

## 2023-05-08 MED ORDER — OXYCODONE-ACETAMINOPHEN 5-325 MG PO TABS
1.0000 | ORAL_TABLET | Freq: Once | ORAL | Status: AC
  Administered 2023-05-08: 1 via ORAL
  Filled 2023-05-08: qty 1

## 2023-05-08 MED ORDER — HYDROMORPHONE HCL 1 MG/ML IJ SOLN
1.0000 mg | Freq: Once | INTRAMUSCULAR | Status: AC
  Administered 2023-05-08: 1 mg via INTRAMUSCULAR
  Filled 2023-05-08: qty 1

## 2023-05-08 MED ORDER — HYDROCODONE-ACETAMINOPHEN 5-325 MG PO TABS: 1.0000 | ORAL_TABLET | ORAL | 0 refills | Status: DC | PRN

## 2023-05-08 MED ORDER — METHOCARBAMOL 500 MG PO TABS: 500.0000 mg | ORAL_TABLET | Freq: Four times a day (QID) | ORAL | 0 refills | Status: DC

## 2023-05-08 MED ORDER — METHYLPREDNISOLONE SODIUM SUCC 125 MG IJ SOLR
125.0000 mg | Freq: Once | INTRAMUSCULAR | Status: AC
  Administered 2023-05-08: 125 mg via INTRAMUSCULAR
  Filled 2023-05-08: qty 2

## 2023-05-08 MED ORDER — KETOROLAC TROMETHAMINE 30 MG/ML IJ SOLN
30.0000 mg | Freq: Once | INTRAMUSCULAR | Status: AC
  Administered 2023-05-08: 30 mg via INTRAMUSCULAR
  Filled 2023-05-08: qty 1

## 2023-05-08 NOTE — ED Provider Notes (Signed)
[place] EMERGENCY DEPARTMENT AT [facility] Provider Note   CSN: [id number] Arrival date & time: 05/08/23  1553     History  Chief Complaint  Patient presents with   Back Pain    [name] is a 52 y.o. male.  Pt reports he began having pain in his low back yesterday after bending over.  Patient complains of pain down his right leg.  Patient complains of pain with moving.  Patient denies any history of back pain.  Patient points to the right flank area as area of pain.  Patient reports discomfort in right buttock and down the back of right leg.  Patient denies any weakness he denies any loss of bowel or bladder control.  Patient has had not had any discomfort with urination.  He denies any fever or chills  The history is provided by the patient. No language interpreter was used.  Back Pain Location:  Generalized Quality:  Aching Radiates to:  R posterior upper leg Pain severity:  Moderate Pain is:  Same all the time      Home Medications Prior to Admission medications   Medication Sig Start Date End Date Taking? Authorizing Provider  amLODipine-olmesartan (AZOR) 5-40 MG tablet Take 1 tablet by mouth daily. 12/16/22   Maisie Fus, MD  bictegravir-emtricitabine-tenofovir AF (BIKTARVY) 50-200-25 MG TABS tablet Take 1 tablet by mouth daily. 11/30/22   Randall Hiss, MD  meloxicam (MOBIC) 15 MG tablet Take 1 tablet (15 mg total) by mouth daily. 08/05/21   Madelyn Brunner, DO  Pitavastatin Magnesium 4 MG TABS Take 1 tablet (4 mg total) by mouth daily. 11/30/22   Randall Hiss, MD      Allergies    Patient has no known allergies.    Review of Systems   Review of Systems  Musculoskeletal:  Positive for back pain.  All other systems reviewed and are negative.   Physical Exam Updated Vital Signs BP (!) 156/77 (BP Location: Right Arm)   Pulse 100   Temp 98.3 F (36.8 C) (Oral)   Resp 16   Ht 5\' 8"  (1.727 m)   Wt 101.2 kg   SpO2 100%   BMI 33.92  kg/m  Physical Exam Vitals and nursing note reviewed.  Constitutional:      Appearance: He is well-developed.  HENT:     Head: Normocephalic.     Mouth/Throat:     Mouth: Mucous membranes are moist.  Eyes:     Pupils: Pupils are equal, round, and reactive to light.  Cardiovascular:     Rate and Rhythm: Normal rate.  Pulmonary:     Effort: Pulmonary effort is normal.  Abdominal:     General: Abdomen is flat. There is no distension.  Musculoskeletal:        General: Normal range of motion.     Cervical back: Normal range of motion.  Skin:    General: Skin is warm.  Neurological:     General: No focal deficit present.     Mental Status: He is alert and oriented to person, place, and time.  Psychiatric:        Mood and Affect: Mood normal.     ED Results / Procedures / Treatments   Labs (all labs ordered are listed, but only abnormal results are displayed) Labs Reviewed  URINALYSIS, ROUTINE W REFLEX MICROSCOPIC - Abnormal; Notable for the following components:      Result Value   Bacteria, UA RARE (*)  All other components within normal limits  I-STAT CHEM 8, ED - Abnormal; Notable for the following components:   Potassium 3.3 (*)    Glucose, Bld 119 (*)    Calcium, Ion 1.05 (*)    All other components within normal limits    EKG None  Radiology CT Renal Stone Study  Result Date: 05/08/2023 CLINICAL DATA:  Right-sided flank pain for 2 days, initial encounter EXAM: CT ABDOMEN AND PELVIS WITHOUT CONTRAST TECHNIQUE: Multidetector CT imaging of the abdomen and pelvis was performed following the standard protocol without IV contrast. RADIATION DOSE REDUCTION: This exam was performed according to the departmental dose-optimization program which includes automated exposure control, adjustment of the mA and/or kV according to patient size and/or use of iterative reconstruction technique. COMPARISON:  None Available. FINDINGS: Lower chest: No acute abnormality. Hepatobiliary:  No focal liver abnormality is seen. No gallstones, gallbladder wall thickening, or biliary dilatation. Pancreas: Unremarkable. No pancreatic ductal dilatation or surrounding inflammatory changes. Spleen: Normal in size without focal abnormality. Adrenals/Urinary Tract: Adrenal glands are within normal limits. Kidneys demonstrate a normal appearance without evidence of renal calculi or obstructive change. The bladder is well distended. Stomach/Bowel: Scattered colonic diverticular changes noted without evidence of diverticulitis. The appendix is within normal limits. Small bowel and stomach are within limits. Vascular/Lymphatic: Aortic atherosclerosis. No enlarged abdominal or pelvic lymph nodes. Reproductive: Prostate is unremarkable. Other: No abdominal wall hernia or abnormality. No abdominopelvic ascites. Musculoskeletal: No acute or significant osseous findings. IMPRESSION: Diverticulosis without diverticulitis. No renal calculi or obstructive changes are noted. Electronically Signed   By: Alcide Clever M.D.   On: 05/08/2023 19:55   DG Lumbar Spine Complete  Result Date: 05/08/2023 CLINICAL DATA:  Low back pain, no known injury, initial encounter EXAM: LUMBAR SPINE - COMPLETE 4+ VIEW COMPARISON:  None Available. FINDINGS: Five lumbar type vertebral bodies are well visualized. Vertebral body height is well maintained. Mild osteophytic changes are noted. No pars defects are seen. No anterolisthesis is noted. No soft tissue changes are seen. IMPRESSION: Mild degenerative change without acute abnormality. Electronically Signed   By: Alcide Clever M.D.   On: 05/08/2023 17:11    Procedures Procedures    Medications Ordered in ED Medications  oxyCODONE-acetaminophen (PERCOCET/ROXICET) 5-325 MG per tablet 1 tablet (1 tablet Oral Given 05/08/23 1607)  methylPREDNISolone sodium succinate (SOLU-MEDROL) 125 mg/2 mL injection 125 mg (125 mg Intramuscular Given 05/08/23 1845)  ketorolac (TORADOL) 30 MG/ML injection  30 mg (30 mg Intramuscular Given 05/08/23 1846)  HYDROmorphone (DILAUDID) injection 1 mg (1 mg Intramuscular Given 05/08/23 1943)    ED Course/ Medical Decision Making/ A&P                             Medical Decision Making Patient complains of pain in his low back since yesterday.  Patient reports he is having pain down his right leg  Amount and/or Complexity of Data Reviewed Labs: ordered. Decision-making details documented in ED Course.    Details: Labs ordered reviewed and interpreted UA is negative for blood Radiology: ordered and independent interpretation performed. Decision-making details documented in ED Course.    Details: X-rays ordered reviewed and interpreted CT renal no stone, patient has some evidence of diverticulosis no diverticulitis  Risk Prescription drug management. Risk Details: Patient given an injection of Toradol and Solu-Medrol.  Patient had no relief from discomfort patient is given a second injection of Dilaudid 1 mg IM.  I think patient's  pain is musculoskeletal.  Patient has some right-sided sciatica with pain.  I have advised patient to follow-up with his primary care physician for recheck I will treat him with prednisone, Robaxin and 10 tablets hydrocodone.  Patient is advised to return to the emergency department if symptoms worsen or change           Final Clinical Impression(s) / ED Diagnoses Final diagnoses:  Acute low back pain with right-sided sciatica, unspecified back pain laterality    Rx / DC Orders ED Discharge Orders          Ordered    predniSONE (DELTASONE) 50 MG tablet        05/08/23 2100    HYDROcodone-acetaminophen (NORCO/VICODIN) 5-325 MG tablet  Every 4 hours PRN        05/08/23 2100    methocarbamol (ROBAXIN) 500 MG tablet  4 times daily        05/08/23 2100          An After Visit Summary was printed and given to the patient.     Osie Cheeks 05/08/23 2100    Terald Sleeper, MD 05/08/23 [id number]

## 2023-05-08 NOTE — ED Triage Notes (Signed)
Pt c/o right lower back pain that radiate down right leg. Pt states it started when he bent over yesterday. Pt denies any injury. Pt states has numbness and tingling down right leg. Pt walked very slowly to triage and is in obvious pain. Pt denies loss of bowel or bladder.

## 2023-05-10 ENCOUNTER — Other Ambulatory Visit: Payer: Self-pay

## 2023-05-10 ENCOUNTER — Encounter: Payer: Self-pay | Admitting: *Deleted

## 2023-05-10 ENCOUNTER — Other Ambulatory Visit: Payer: Self-pay | Admitting: Physician Assistant

## 2023-05-10 VITALS — BP 138/83 | HR 76 | Temp 98.4°F | Wt 215.5 lb

## 2023-05-10 DIAGNOSIS — B20 Human immunodeficiency virus [HIV] disease: Secondary | ICD-10-CM

## 2023-05-10 DIAGNOSIS — Z006 Encounter for examination for normal comparison and control in clinical research program: Secondary | ICD-10-CM

## 2023-05-10 MED ORDER — BIKTARVY 50-200-25 MG PO TABS
1.0000 | ORAL_TABLET | Freq: Every day | ORAL | 11 refills | Status: DC
Start: 2023-05-10 — End: 2023-08-18

## 2023-05-10 NOTE — Research (Signed)
[name] was here today for his week 528 visit for A5321. He reports severe spasmodic muscle pain on his rt side extending down his leg. Denies any increasing numbness, loss of bowel/bladder. He was seen in Ed 2 days ago for this and was given oral meds to take but has not gotten them filled yet. He said he had also run out of his Biktarvy, needed to get his HMAP renewed and needed some med samples. He was seen by Arvilla Meres, PA as well during the visit. We instructed him to pick up the meds and get started on them, gave him a sample bottle of Biktarvy and refilled his Biktarvy . He is due to see Dr. Daiva Eves next week and research again in 1 year.

## 2023-05-10 NOTE — Addendum Note (Signed)
Addended by: Horton Finer on: 05/10/2023 03:54 PM   Modules accepted: Orders

## 2023-05-11 LAB — CREATININE, SERUM: Creat: 0.84 mg/dL (ref 0.70–1.30)

## 2023-05-11 LAB — HEPATITIS C ANTIBODY: Hepatitis C Ab: NONREACTIVE

## 2023-05-12 ENCOUNTER — Other Ambulatory Visit: Payer: Self-pay | Admitting: Pharmacist

## 2023-05-12 DIAGNOSIS — B20 Human immunodeficiency virus [HIV] disease: Secondary | ICD-10-CM

## 2023-05-12 MED ORDER — BIKTARVY 50-200-25 MG PO TABS
1.0000 | ORAL_TABLET | Freq: Every day | ORAL | Status: AC
Start: 2023-05-10 — End: 2023-05-17

## 2023-05-18 ENCOUNTER — Ambulatory Visit: Payer: Self-pay | Admitting: Infectious Disease

## 2023-06-22 ENCOUNTER — Ambulatory Visit: Payer: Self-pay | Admitting: Infectious Disease

## 2023-06-22 NOTE — Progress Notes (Deleted)
Subjective:  Chief complaint: Follow-up for HIV disease on medications       Patient ID: [name], male    DOB: [birth year], 52 y.o.   MRN: [id number]  HPI  Ledford is a 52year old African man  ([place] Citizen) who is living with HIV that has been perfectly controlled x >15 years, currently on Biktarvy.             Past Medical History:  Diagnosis Date   Blood per rectum 11/07/2018   Blurry vision 02/03/2016   Broken tooth 06/23/2022   Depression    Fever, unspecified 07/29/2015   Headache 07/07/2022   HIV disease (HCC) 02/24/2015   HIV infection (HCC)    Hyperlipidemia 06/23/2022   Hypertension    Low testosterone 02/24/2015   Lower back pain 07/29/2015   Myalgia 07/23/2021   Neck pain 12/21/2021   Obesity 04/29/2021   Seasonal allergies 07/07/2022   Smoker 01/30/2020   Tinea pedis 07/29/2015   Vaccine counseling 07/07/2022    No past surgical history on file.  No family history on file.    Social History   Socioeconomic History   Marital status: Legally Separated    Spouse name: Not on file   Number of children: Not on file   Years of education: Not on file   Highest education level: Not on file  Occupational History   Not on file  Tobacco Use   Smoking status: Every Day    Current packs/day: 0.25    Types: Cigarettes   Smokeless tobacco: Never   Tobacco comments:    pt. not ready to quit smoking at this timeA1/2PPD; cutting back  Substance and Sexual Activity   Alcohol use: Yes    Alcohol/week: 3.0 standard drinks of alcohol    Types: 3 Standard drinks or equivalent per week    Comment: socially   Drug use: No   Sexual activity: Yes    Comment: declined condoms  Other Topics Concern   Not on file  Social History Narrative   Not on file   Social Determinants of Health   Financial Resource Strain: Not on file  Food Insecurity: Not on file  Transportation Needs: Not on file  Physical Activity: Not on file  Stress: Not on file  Social Connections:  Not on file    No Known Allergies   Current Outpatient Medications:    amLODipine-olmesartan (AZOR) 5-40 MG tablet, Take 1 tablet by mouth daily., Disp: 90 tablet, Rfl: 3   bictegravir-emtricitabine-tenofovir AF (BIKTARVY) 50-200-25 MG TABS tablet, Take 1 tablet by mouth daily., Disp: 30 tablet, Rfl: 11   bictegravir-emtricitabine-tenofovir AF (BIKTARVY) 50-200-25 MG TABS tablet, Take 1 tablet by mouth daily for 7 days., Disp: , Rfl:    HYDROcodone-acetaminophen (NORCO/VICODIN) 5-325 MG tablet, Take 1 tablet by mouth every 4 (four) hours as needed for moderate pain., Disp: 10 tablet, Rfl: 0   methocarbamol (ROBAXIN) 500 MG tablet, Take 1 tablet (500 mg total) by mouth 4 (four) times daily., Disp: 20 tablet, Rfl: 0   predniSONE (DELTASONE) 50 MG tablet, One tablet a day, Disp: 5 tablet, Rfl: 0   Review of Systems     Objective:   Physical Exam        Assessment & Plan:   HIV disease:  I will add order HIV viral load CD4 count CBC with differential CMP, RPR GC and chlamydia and I will continue  Nemiah Filo's Biktarvy,prescription  Hyperlipidemia: will renew pitavastatin  HTN: poorly controlled, will  re-order zestoretic  Vaccine counseling: recommended annual flu shot and DTaP vaccine today and COVID 19 when alible.

## 2023-07-18 ENCOUNTER — Other Ambulatory Visit: Payer: Self-pay | Admitting: Infectious Disease

## 2023-07-24 ENCOUNTER — Other Ambulatory Visit: Payer: Self-pay | Admitting: Infectious Disease

## 2023-07-25 ENCOUNTER — Other Ambulatory Visit: Payer: Self-pay | Admitting: Infectious Disease

## 2023-07-25 ENCOUNTER — Telehealth: Payer: Self-pay

## 2023-07-25 MED ORDER — AMLODIPINE-OLMESARTAN 5-40 MG PO TABS: 1.0000 | ORAL_TABLET | Freq: Every day | ORAL | 11 refills | Status: DC

## 2023-07-25 NOTE — Telephone Encounter (Signed)
Patient called saying the pharmacy called him stating his refill for blood pressure medicine was denied. Notified him that Dr. Daiva Eves sent in this medication around 3 this afternoon. Advised him to call the pharmacy and request that they get his refill ready.  Sandie Ano, RN

## 2023-07-25 NOTE — Telephone Encounter (Signed)
Rescheduled patient for overdue appointment with Dr. Daiva Eves for 08/18/23. Patient said [facility] from on [address] sent a medication request to have his blood pressure medication hydrochlorothiazide to be filled. Best contact number is [phone]

## 2023-07-26 NOTE — Telephone Encounter (Signed)
Patient can not afford the Amlodipine-Olmesartan.  Can he be changed to something else.

## 2023-07-27 NOTE — Telephone Encounter (Signed)
I see amlodipine/olmesartan on the [facility] formulary, and it was sent to an appropriate [facility] (on [address]), so I'm not sure what the issue is here. Benicar, Benicar HCT, Diovan, Diovan HCT, Exforge, Exforge HCT, Lotrel, Norvasc, and Tribenzor are also all on the list. The manual I'm looking at was last updated on December 29, 2022.

## 2023-07-27 NOTE — Telephone Encounter (Signed)
It's covered. This has been resolved. Patient can pick the medication up today. Thanks Marchelle Folks!

## 2023-08-18 ENCOUNTER — Other Ambulatory Visit: Payer: Self-pay

## 2023-08-18 ENCOUNTER — Encounter: Payer: Self-pay | Admitting: Infectious Disease

## 2023-08-18 ENCOUNTER — Ambulatory Visit (INDEPENDENT_AMBULATORY_CARE_PROVIDER_SITE_OTHER): Payer: Self-pay | Admitting: Infectious Disease

## 2023-08-18 VITALS — BP 135/76 | HR 88 | Temp 98.2°F | Ht 68.0 in | Wt 222.0 lb

## 2023-08-18 DIAGNOSIS — I10 Essential (primary) hypertension: Secondary | ICD-10-CM

## 2023-08-18 DIAGNOSIS — G8929 Other chronic pain: Secondary | ICD-10-CM

## 2023-08-18 DIAGNOSIS — Z7185 Encounter for immunization safety counseling: Secondary | ICD-10-CM

## 2023-08-18 DIAGNOSIS — M545 Low back pain, unspecified: Secondary | ICD-10-CM

## 2023-08-18 DIAGNOSIS — Z23 Encounter for immunization: Secondary | ICD-10-CM

## 2023-08-18 DIAGNOSIS — B20 Human immunodeficiency virus [HIV] disease: Secondary | ICD-10-CM

## 2023-08-18 DIAGNOSIS — E785 Hyperlipidemia, unspecified: Secondary | ICD-10-CM

## 2023-08-18 DIAGNOSIS — R0789 Other chest pain: Secondary | ICD-10-CM

## 2023-08-18 MED ORDER — AMLODIPINE-OLMESARTAN 5-40 MG PO TABS: 1.0000 | ORAL_TABLET | Freq: Every day | ORAL | 11 refills | Status: DC

## 2023-08-18 MED ORDER — PITAVASTATIN MAGNESIUM 4 MG PO TABS: 4.0000 mg | ORAL_TABLET | Freq: Every day | ORAL | 11 refills | Status: DC

## 2023-08-18 MED ORDER — BIKTARVY 50-200-25 MG PO TABS
1.0000 | ORAL_TABLET | Freq: Every day | ORAL | 11 refills | Status: DC
Start: 2023-08-18 — End: 2024-03-21

## 2023-08-18 NOTE — Progress Notes (Signed)
Subjective:  Chief complaint follow-up for HIV disease on medications   Patient ID: [name], male    DOB: [birth year], 52 y.o.   MRN: [id number]  HPI  Discussed the use of AI scribe software for clinical note transcription with the patient, who gave verbal consent to proceed.  History of Present Illness   The patient, with a history of HIV, hypertension, and chronic back pain, presents for a routine follow-up. His HIV has been well-controlled for fifteen years on Biktarvy. He denies any current chest pain, but reports ongoing back pain. He was previously sent to physical therapy for this issue and received a shot for pain relief during an ER visit in July.  For hypertension, he is currently on a combination of amlodipine and olmesartan. He reports that his previous medication was too expensive, but the current regimen is affordable and effective.  The patient also has a history of high cholesterol and was previously on pitavastatin. He saw a cardiologist in February who changed his blood pressure medication and discussed an exercise stress test, which the patient believes he completed.  The patient is also participating in a research study related to his HIV status. He denies any history of anal intercourse, reducing his risk for rectal cancer. he has only had sex with women during his lifetime. He has received a COVID-19 vaccine, but not the most recent version.       Past Medical History:  Diagnosis Date   Blood per rectum 11/07/2018   Blurry vision 02/03/2016   Broken tooth 06/23/2022   Depression    Fever, unspecified 07/29/2015   Headache 07/07/2022   HIV disease (HCC) 02/24/2015   HIV infection (HCC)    Hyperlipidemia 06/23/2022   Hypertension    Low testosterone 02/24/2015   Lower back pain 07/29/2015   Myalgia 07/23/2021   Neck pain 12/21/2021   Obesity 04/29/2021   Seasonal allergies 07/07/2022   Smoker 01/30/2020   Tinea pedis 07/29/2015   Vaccine counseling 07/07/2022    No  past surgical history on file.  No family history on file.    Social History   Socioeconomic History   Marital status: Legally Separated    Spouse name: Not on file   Number of children: Not on file   Years of education: Not on file   Highest education level: Not on file  Occupational History   Not on file  Tobacco Use   Smoking status: Every Day    Current packs/day: 0.25    Types: Cigarettes   Smokeless tobacco: Never   Tobacco comments:    pt. not ready to quit smoking at this timeA1/2PPD; cutting back  Substance and Sexual Activity   Alcohol use: Yes    Alcohol/week: 3.0 standard drinks of alcohol    Types: 3 Standard drinks or equivalent per week    Comment: socially   Drug use: No   Sexual activity: Yes    Comment: declined condoms  Other Topics Concern   Not on file  Social History Narrative   Not on file   Social Determinants of Health   Financial Resource Strain: Not on file  Food Insecurity: Not on file  Transportation Needs: Not on file  Physical Activity: Not on file  Stress: Not on file  Social Connections: Not on file    No Known Allergies   Current Outpatient Medications:    amLODipine-olmesartan (AZOR) 5-40 MG tablet, Take 1 tablet by mouth daily., Disp: 30 tablet, Rfl: 11  bictegravir-emtricitabine-tenofovir AF (BIKTARVY) 50-200-25 MG TABS tablet, Take 1 tablet by mouth daily., Disp: 30 tablet, Rfl: 11   bictegravir-emtricitabine-tenofovir AF (BIKTARVY) 50-200-25 MG TABS tablet, Take 1 tablet by mouth daily for 7 days., Disp: , Rfl:    HYDROcodone-acetaminophen (NORCO/VICODIN) 5-325 MG tablet, Take 1 tablet by mouth every 4 (four) hours as needed for moderate pain., Disp: 10 tablet, Rfl: 0   methocarbamol (ROBAXIN) 500 MG tablet, Take 1 tablet (500 mg total) by mouth 4 (four) times daily., Disp: 20 tablet, Rfl: 0   predniSONE (DELTASONE) 50 MG tablet, One tablet a day, Disp: 5 tablet, Rfl: 0   Review of Systems  Constitutional:  Negative for  activity change, appetite change, chills, diaphoresis, fatigue, fever and unexpected weight change.  HENT:  Negative for congestion, rhinorrhea, sinus pressure, sneezing, sore throat and trouble swallowing.   Eyes:  Negative for photophobia and visual disturbance.  Respiratory:  Negative for cough, chest tightness, shortness of breath, wheezing and stridor.   Cardiovascular:  Negative for chest pain, palpitations and leg swelling.  Gastrointestinal:  Negative for abdominal distention, abdominal pain, anal bleeding, blood in stool, constipation, diarrhea, nausea and vomiting.  Genitourinary:  Negative for difficulty urinating, dysuria, flank pain and hematuria.  Musculoskeletal:  Negative for arthralgias, back pain, gait problem, joint swelling and myalgias.  Skin:  Negative for color change, pallor, rash and wound.  Neurological:  Negative for dizziness, tremors, weakness and light-headedness.  Hematological:  Negative for adenopathy. Does not bruise/bleed easily.  Psychiatric/Behavioral:  Negative for agitation, behavioral problems, confusion, decreased concentration, dysphoric mood and sleep disturbance.        Objective:   Physical Exam Constitutional:      Appearance: He is well-developed.  HENT:     Head: Normocephalic and atraumatic.  Eyes:     Conjunctiva/sclera: Conjunctivae normal.  Cardiovascular:     Rate and Rhythm: Normal rate and regular rhythm.  Pulmonary:     Effort: Pulmonary effort is normal. No respiratory distress.     Breath sounds: No wheezing.  Abdominal:     General: There is no distension.     Palpations: Abdomen is soft.  Musculoskeletal:        General: No tenderness. Normal range of motion.     Cervical back: Normal range of motion and neck supple.  Skin:    General: Skin is warm and dry.     Coloration: Skin is not pale.     Findings: No erythema or rash.  Neurological:     General: No focal deficit present.     Mental Status: He is alert and  oriented to person, place, and time.  Psychiatric:        Mood and Affect: Mood normal.        Behavior: Behavior normal.        Thought Content: Thought content normal.        Judgment: Judgment normal.           Assessment & Plan:   Assessment and Plan    HIV Well controlled for 15 years on Biktarvy. -Continue Biktarvy. -Check labs today including HIV RNA quant, CD4   Hypertension On amlodipine/olmesartan combination therapy. -Continue amlodipine/olmesartan.  Hyperlipidemia Not currently on statin therapy. -Start pitavastatin as per guidelines for HIV patients over 40.  Chronic Back Pain Previously managed with physical therapy and pain medication. -Continue current management plan.  General Health Maintenance -Administer updated COVID-19 vaccine and annual flu vaccine today. -Follow-up in 6 months.  Atypical chest pain:  I will send note to Dr. Wyline Mood re ETT I dont think he ever had it. It was scheduled in March. I have warned him re the higher risk of CAD in patients living with HIV

## 2023-08-21 LAB — COMPLETE METABOLIC PANEL WITHOUT GFR
AG Ratio: 1.4 (calc) (ref 1.0–2.5)
ALT: 20 U/L (ref 9–46)
AST: 16 U/L (ref 10–35)
Albumin: 4.2 g/dL (ref 3.6–5.1)
Alkaline phosphatase (APISO): 91 U/L (ref 35–144)
BUN: 14 mg/dL (ref 7–25)
CO2: 26 mmol/L (ref 20–32)
Calcium: 9.5 mg/dL (ref 8.6–10.3)
Chloride: 104 mmol/L (ref 98–110)
Creat: 0.93 mg/dL (ref 0.70–1.30)
Globulin: 2.9 g/dL (ref 1.9–3.7)
Glucose, Bld: 74 mg/dL (ref 65–99)
Potassium: 3.9 mmol/L (ref 3.5–5.3)
Sodium: 139 mmol/L (ref 135–146)
Total Bilirubin: 0.7 mg/dL (ref 0.2–1.2)
Total Protein: 7.1 g/dL (ref 6.1–8.1)
eGFR: 98 mL/min/1.73m2

## 2023-08-21 LAB — CBC WITH DIFFERENTIAL/PLATELET
Absolute Lymphocytes: 2833 {cells}/uL (ref 850–3900)
Absolute Monocytes: 719 {cells}/uL (ref 200–950)
Basophils Absolute: 50 {cells}/uL (ref 0–200)
Basophils Relative: 0.8 %
Eosinophils Absolute: 81 {cells}/uL (ref 15–500)
Eosinophils Relative: 1.3 %
HCT: 42.4 % (ref 38.5–50.0)
Hemoglobin: 14.5 g/dL (ref 13.2–17.1)
MCH: 34.6 pg — ABNORMAL HIGH (ref 27.0–33.0)
MCHC: 34.2 g/dL (ref 32.0–36.0)
MCV: 101.2 fL — ABNORMAL HIGH (ref 80.0–100.0)
MPV: 9.8 fL (ref 7.5–12.5)
Monocytes Relative: 11.6 %
Neutro Abs: 2517 {cells}/uL (ref 1500–7800)
Neutrophils Relative %: 40.6 %
Platelets: 252 10*3/uL (ref 140–400)
RBC: 4.19 10*6/uL — ABNORMAL LOW (ref 4.20–5.80)
RDW: 11.5 % (ref 11.0–15.0)
Total Lymphocyte: 45.7 %
WBC: 6.2 10*3/uL (ref 3.8–10.8)

## 2023-08-21 LAB — HIV-1 RNA QUANT-NO REFLEX-BLD
HIV 1 RNA Quant: <20 {copies}/mL — ABNORMAL HIGH
HIV-1 RNA Quant, Log: <1.3 {Log_copies}/mL — ABNORMAL HIGH

## 2023-08-21 LAB — RPR: RPR Ser Ql: NONREACTIVE

## 2023-08-21 LAB — LIPID PANEL
Cholesterol: 219 mg/dL — ABNORMAL HIGH
HDL: 67 mg/dL
LDL Cholesterol (Calc): 125 mg/dL — ABNORMAL HIGH
Non-HDL Cholesterol (Calc): 152 mg/dL — ABNORMAL HIGH
Total CHOL/HDL Ratio: 3.3 (calc)
Triglycerides: 154 mg/dL — ABNORMAL HIGH

## 2023-08-22 LAB — URINE CYTOLOGY ANCILLARY ONLY
Chlamydia: NEGATIVE
Comment: NEGATIVE
Comment: NORMAL
Neisseria Gonorrhea: NEGATIVE

## 2023-08-22 LAB — T-HELPER CELLS (CD4) COUNT (NOT AT ARMC)
CD4 % Helper T Cell: 32 % — ABNORMAL LOW (ref 33–65)
CD4 T Cell Abs: 799 /uL (ref 400–1790)

## 2023-12-16 IMAGING — CT CT MAXILLOFACIAL W/O CM
3 series · 16 of 47 positions shown, 19 images · non-contrast
Comparison: None Available.

CLINICAL DATA: Head trauma, moderate to severe.

EXAM:
CT HEAD WITHOUT CONTRAST
CT MAXILLOFACIAL WITHOUT CONTRAST
TECHNIQUE: Multidetector CT imaging of the head and maxillofacial structures
were performed using the standard protocol without intravenous
contrast. Multiplanar CT image reconstructions of the maxillofacial
structures were also generated.
RADIATION DOSE REDUCTION: This exam was performed according to the
departmental dose-optimization program which includes automated
exposure control, adjustment of the mA and/or kV according to
patient size and/or use of iterative reconstruction technique.

[Series 3: facialbone 2.0 st · axial · 0.39mm/px · z∈[-195,-49]mm · 10 of 85 slices shown, 13 images]
[im 6/85  brain]
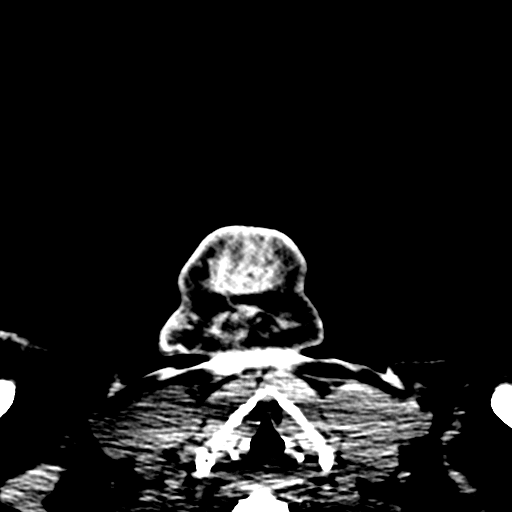
[im 6/85  bone]
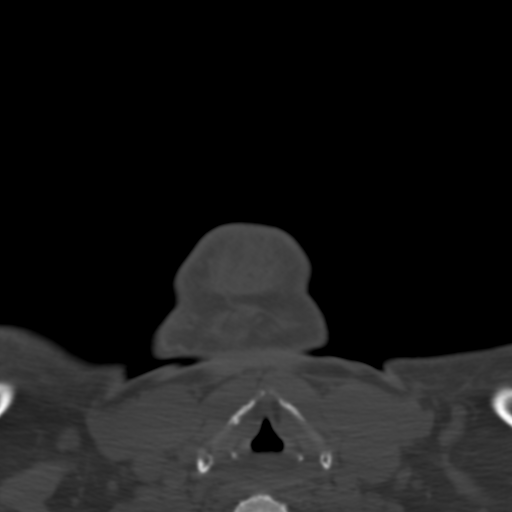
[im 15/85  bone]
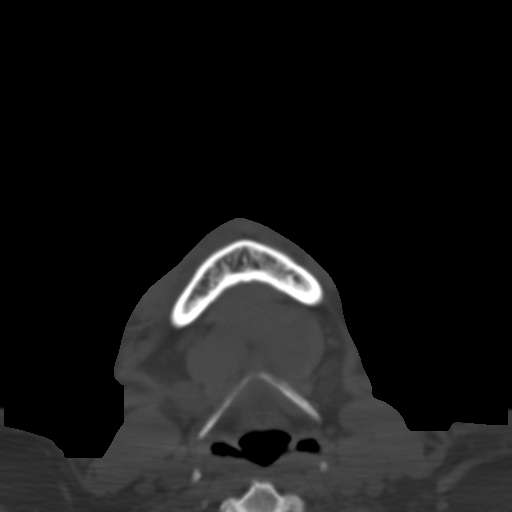
[im 24/85  bone]
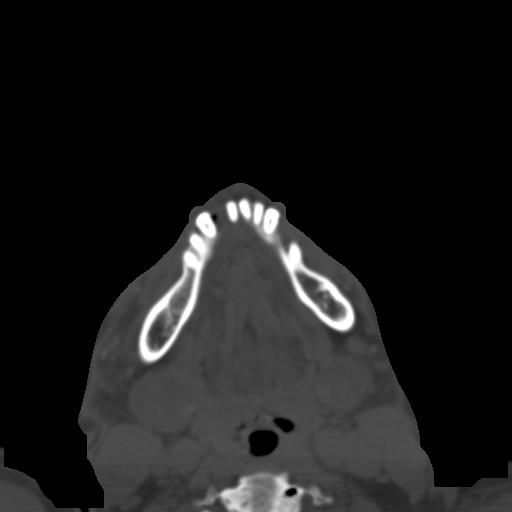
[im 29/85  bone]
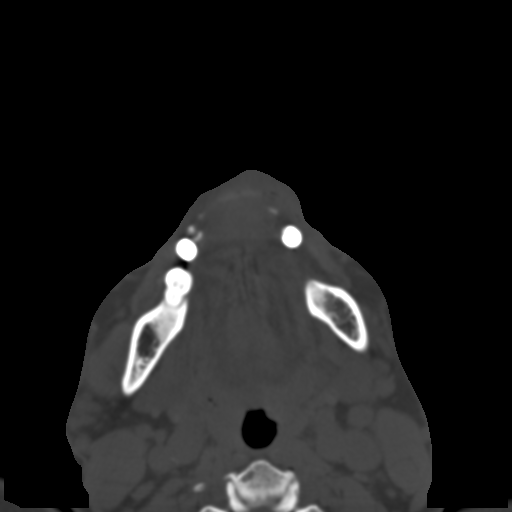
[im 38/85  brain]
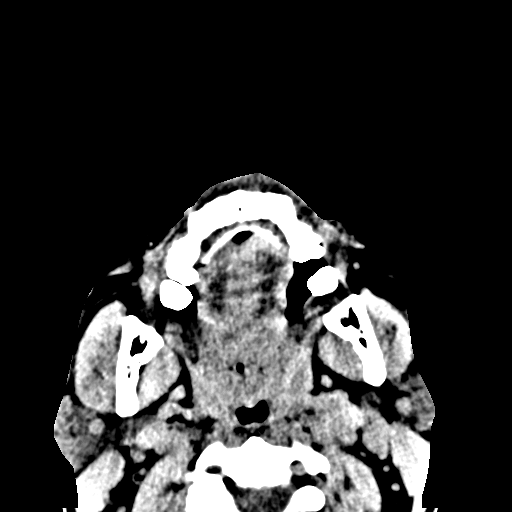
[im 38/85  bone]
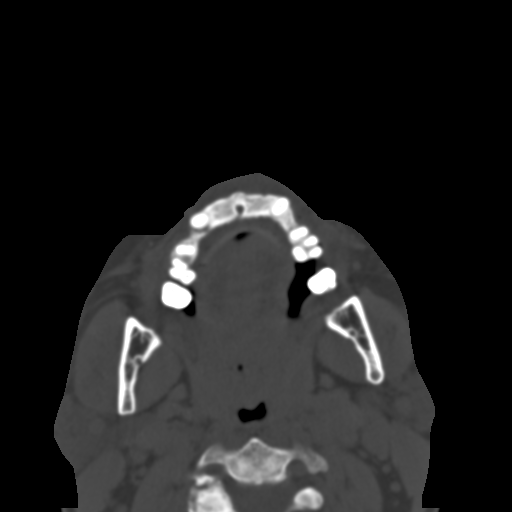
[im 47/85  bone]
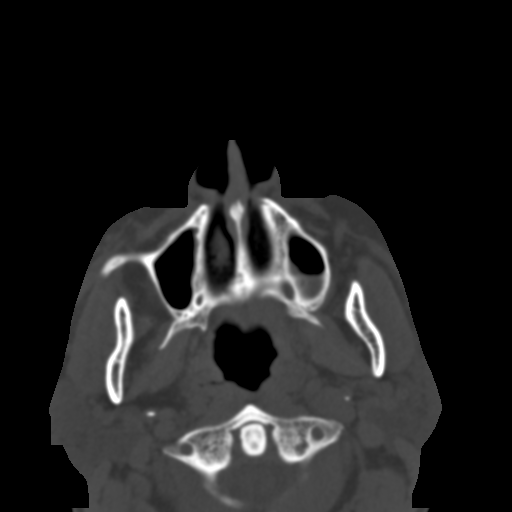
[im 56/85  bone]
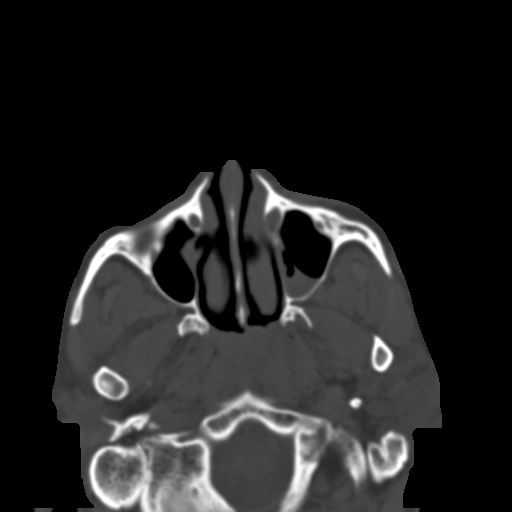
[im 64/85  bone]
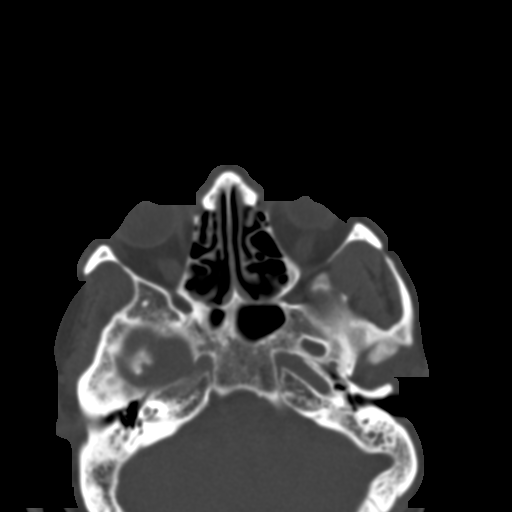
[im 70/85  brain]
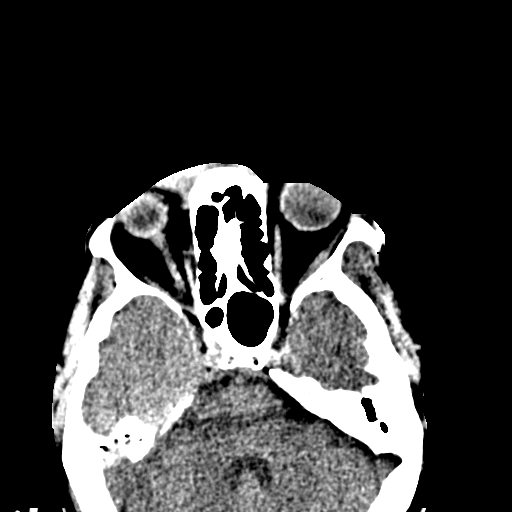
[im 70/85  bone]
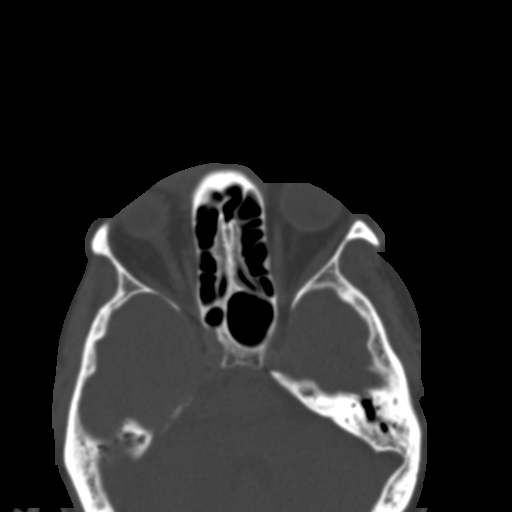
[im 79/85  bone]
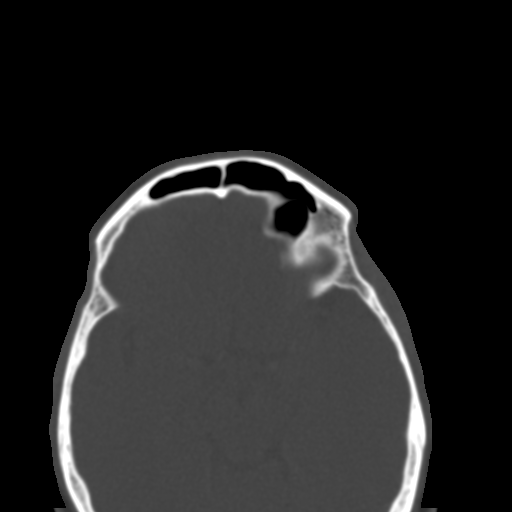

[Series 9: facialbone 2.0 cor st · coronal · 0.33mm/px · 3 of 74 slices shown]
[im 25/74  bone]
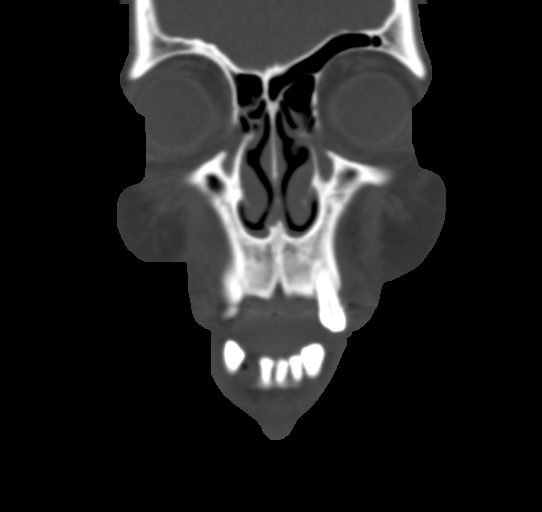
[im 33/74  bone]
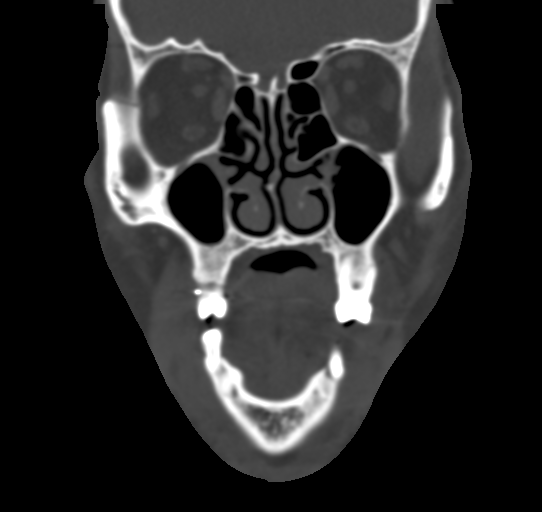
[im 41/74  bone]
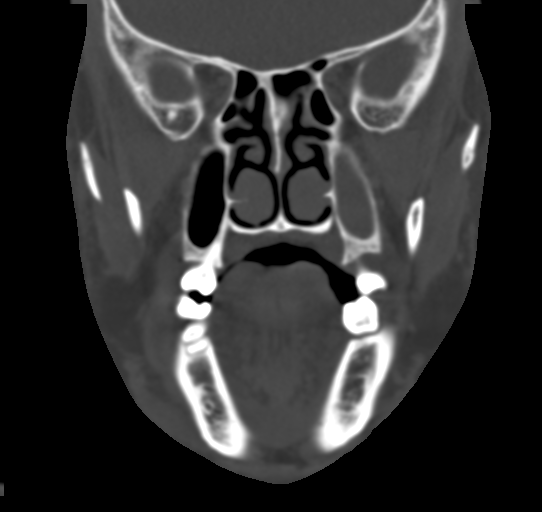

[Series 10: facialbone 2.0 sag st · sagittal · 0.33mm/px · 3 of 91 slices shown]
[im 31/91  bone]
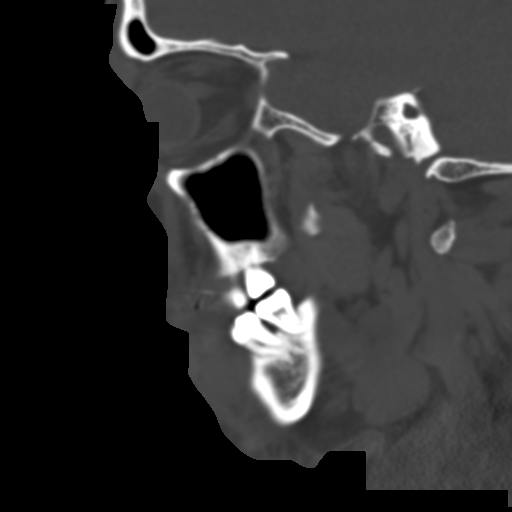
[im 46/91  bone]
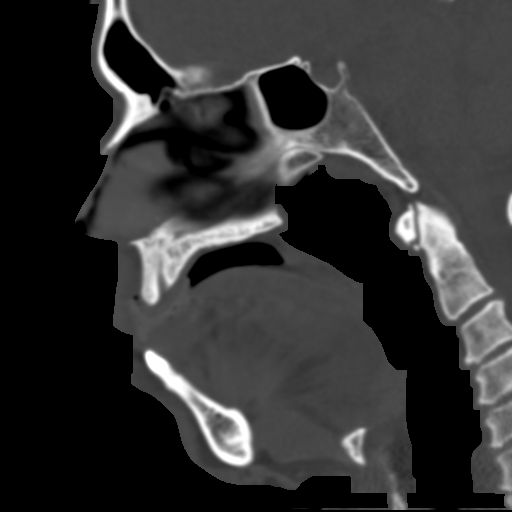
[im 61/91  bone]
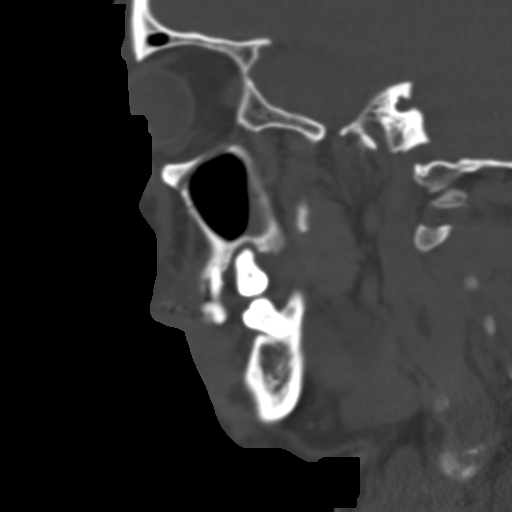

[16 of 47 positions shown; findings below may reference images not displayed]

FINDINGS: CT HEAD FINDINGS

Brain: No evidence of swelling, infarction, hemorrhage,
hydrocephalus, extra-axial collection or mass lesion/mass effect.

Vascular: No hyperdense vessel or unexpected calcification.

Skull: Negative for fracture

CT MAXILLOFACIAL FINDINGS

Osseous: No acute fracture or mandibular dislocation. There are some
missing teeth but no alveolar ridge findings to imply traumatic
extraction. Left upper molar periapical erosion.

Orbits: No visible injury

Sinuses: Negative for hemosinus. Secretions in the left maxillary
sinus

Soft tissues: Swelling around the jaw, right supraorbital soft
tissues, and nose with a small bubble of gas along the nasal bridge
suggesting laceration.
IMPRESSION: 1. Facial contusion/laceration without acute fracture.
2. No evidence of intracranial injury.

## 2023-12-16 IMAGING — CT CT HEAD W/O CM
4 series · 16 of 47 positions shown, 18 images · non-contrast
Comparison: None Available.

CLINICAL DATA: Head trauma, moderate to severe.

EXAM:
CT HEAD WITHOUT CONTRAST
CT MAXILLOFACIAL WITHOUT CONTRAST
TECHNIQUE: Multidetector CT imaging of the head and maxillofacial structures
were performed using the standard protocol without intravenous
contrast. Multiplanar CT image reconstructions of the maxillofacial
structures were also generated.
RADIATION DOSE REDUCTION: This exam was performed according to the
departmental dose-optimization program which includes automated
exposure control, adjustment of the mA and/or kV according to
patient size and/or use of iterative reconstruction technique.

[Series 3: head without · axial · non-contrast · 0.44mm/px · z∈[-87,+33]mm · 7 of 34 slices shown, 9 images]
[im 5/34  brain]
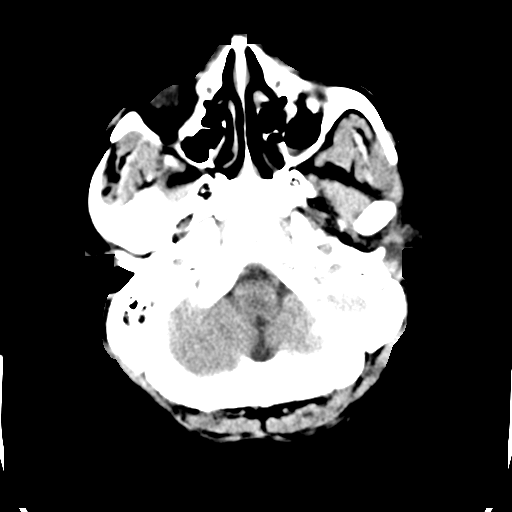
[im 5/34  bone]
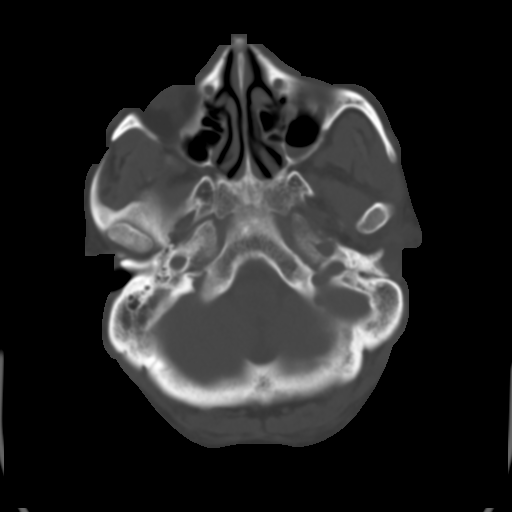
[im 9/34  brain]
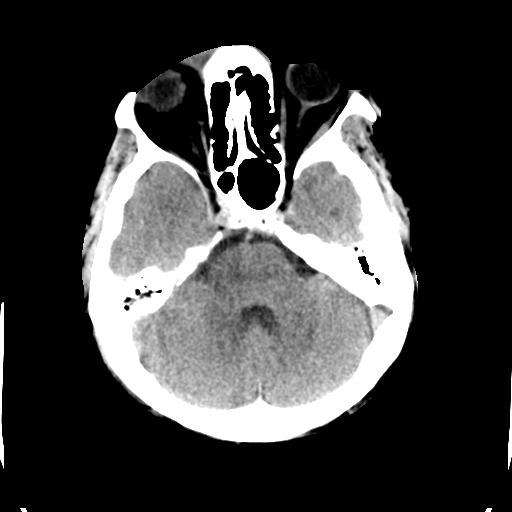
[im 13/34  brain]
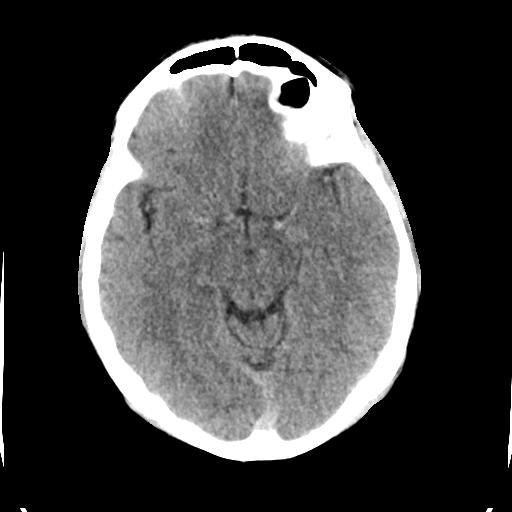
[im 17/34  brain]
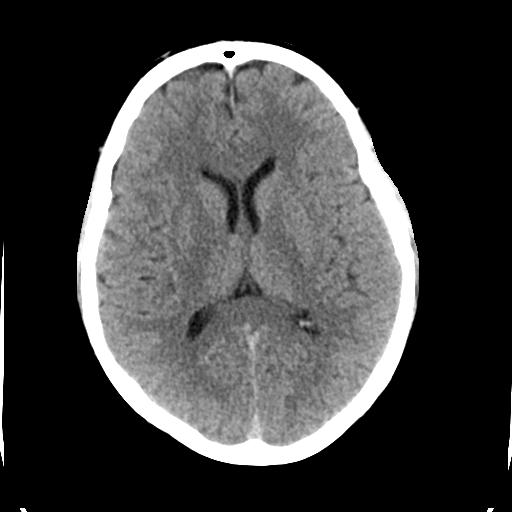
[im 21/34  brain]
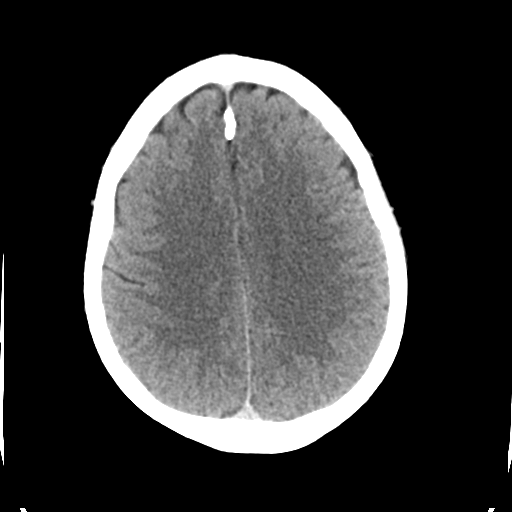
[im 21/34  bone]
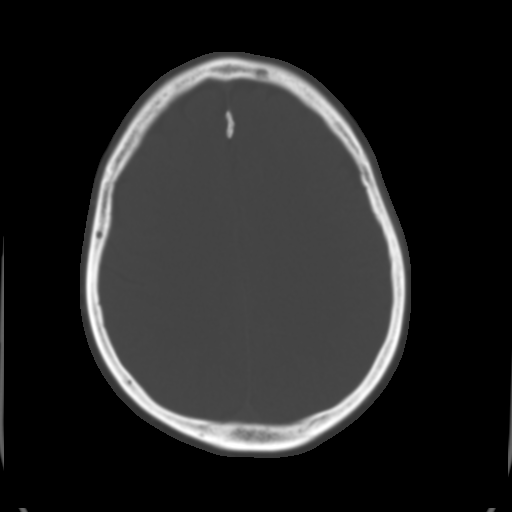
[im 25/34  brain]
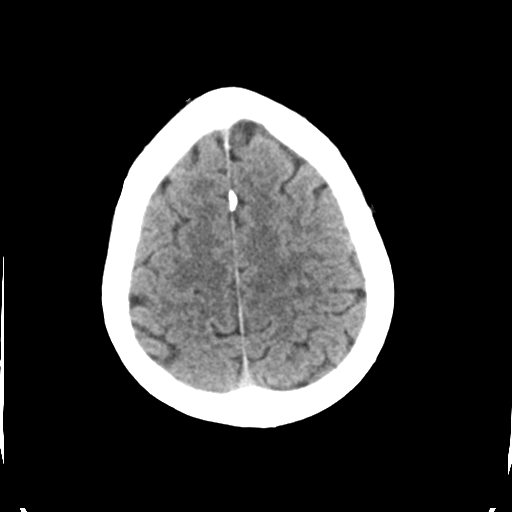
[im 29/34  brain]
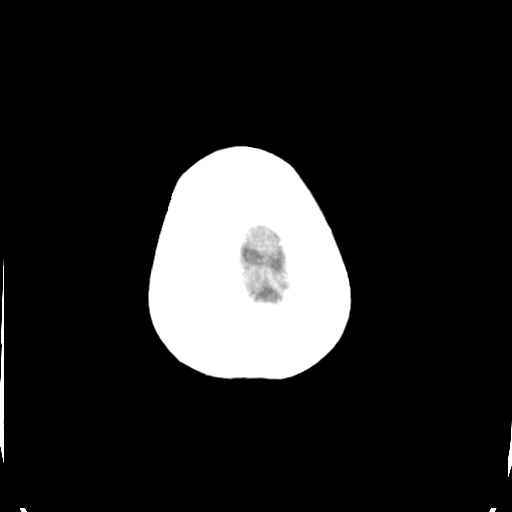

[Series 4: head bone · axial · 0.44mm/px · z∈[-91,-59]mm · 3 of 83 slices shown]
[im 9/83  bone]
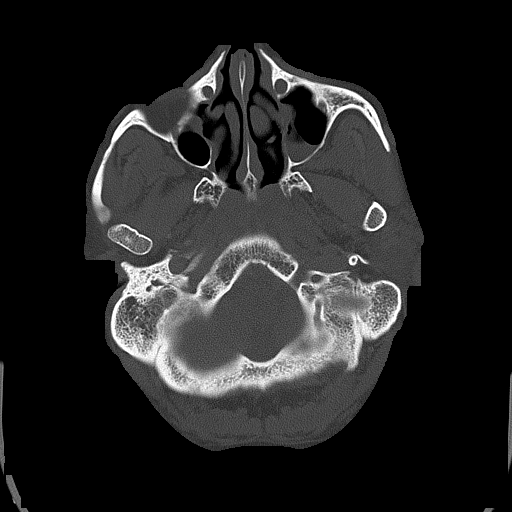
[im 17/83  bone]
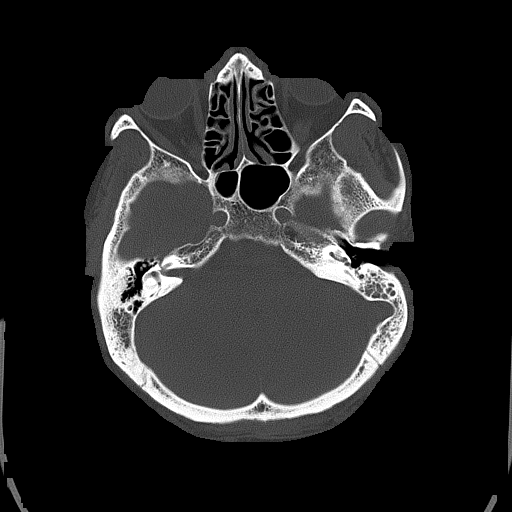
[im 25/83  bone]
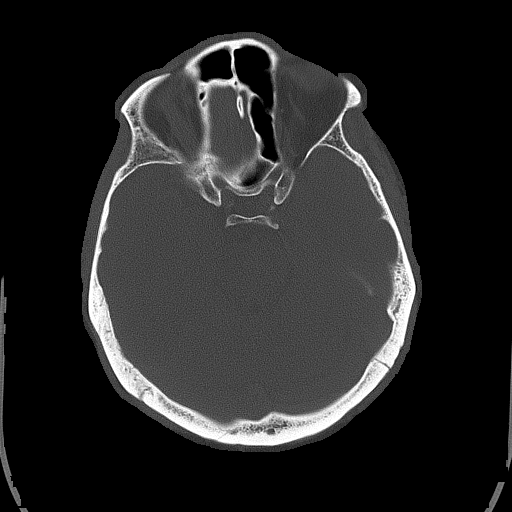

[Series 5: head without cor · coronal · non-contrast · 0.32mm/px · 3 of 70 slices shown]
[im 24/70  brain]
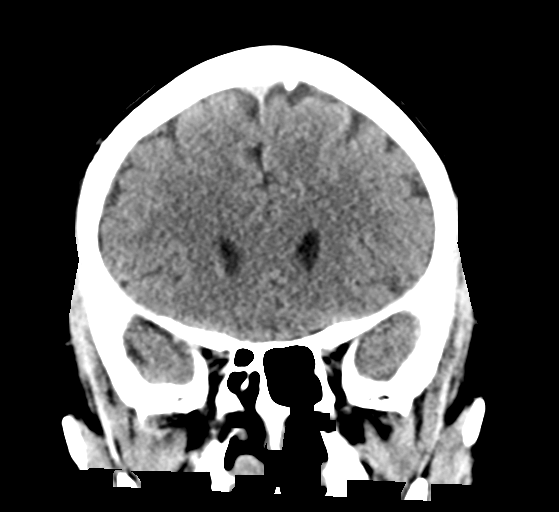
[im 31/70  brain]
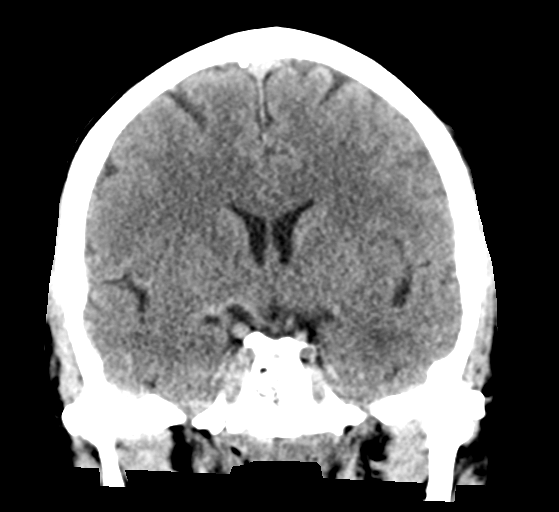
[im 39/70  brain]
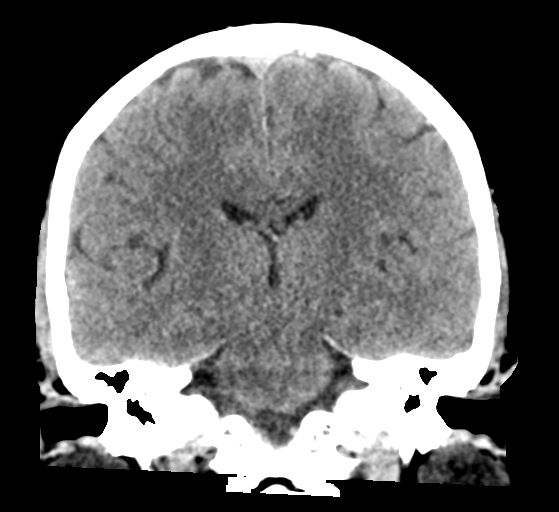

[Series 6: head without sag · sagittal · non-contrast · 0.32mm/px · 3 of 61 slices shown]
[im 21/61  brain]
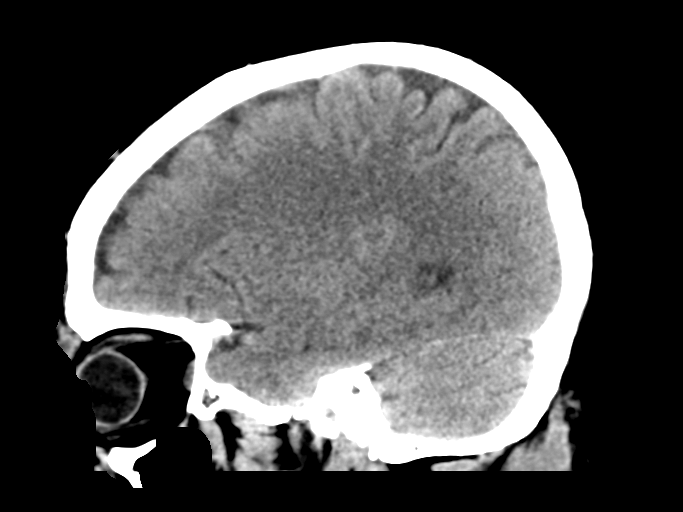
[im 31/61  brain]
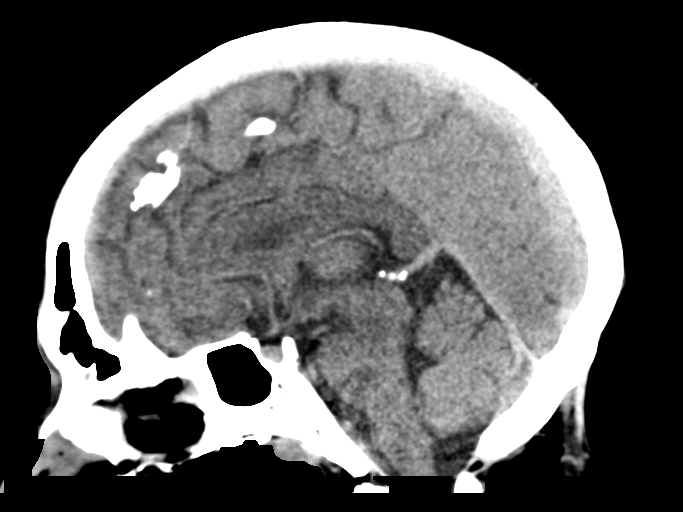
[im 41/61  brain]
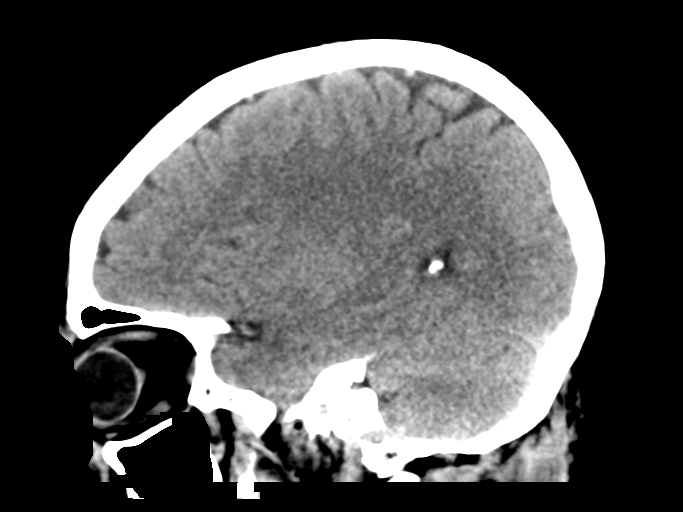

[16 of 47 positions shown; findings below may reference images not displayed]

FINDINGS: CT HEAD FINDINGS

Brain: No evidence of swelling, infarction, hemorrhage,
hydrocephalus, extra-axial collection or mass lesion/mass effect.

Vascular: No hyperdense vessel or unexpected calcification.

Skull: Negative for fracture

CT MAXILLOFACIAL FINDINGS

Osseous: No acute fracture or mandibular dislocation. There are some
missing teeth but no alveolar ridge findings to imply traumatic
extraction. Left upper molar periapical erosion.

Orbits: No visible injury

Sinuses: Negative for hemosinus. Secretions in the left maxillary
sinus

Soft tissues: Swelling around the jaw, right supraorbital soft
tissues, and nose with a small bubble of gas along the nasal bridge
suggesting laceration.
IMPRESSION: 1. Facial contusion/laceration without acute fracture.
2. No evidence of intracranial injury.

## 2024-02-15 ENCOUNTER — Ambulatory Visit: Payer: Self-pay | Admitting: Infectious Disease

## 2024-02-28 NOTE — Progress Notes (Signed)
 The 10-year ASCVD risk score (Arnett DK, et al., 2019) is: 18.9%   Values used to calculate the score:     Age: 53 years     Sex: Male     Is Non-Hispanic African American: Yes     Diabetic: No     Tobacco smoker: Yes     Systolic Blood Pressure: 135 mmHg     Is BP treated: Yes     HDL Cholesterol: 67 mg/dL     Total Cholesterol: 219 mg/dL  Arlon Bergamo, BSN, RN

## 2024-03-20 NOTE — Progress Notes (Unsigned)
 Subjective:  Chief complaint: follow-up for HIV disease on medications   Patient ID: [name], male    DOB: April 27, 1971, 53 y.o.   MRN: [id number]  HPI  Discussed the use of AI scribe software for clinical note transcription with the patient, who gave verbal consent to proceed.  [name] is a 53 year old male with HIV who presents for routine follow-up.  He has a long-standing history of HIV with a consistently undetectable viral load and a current CD4 count of 799. He is on Biktarvy  for HIV management. His medications are covered under the [employer] and [facility] programs, which may need renewal in the summer.  He has hyperlipidemia with recent labs showing elevated LDL cholesterol levels. He is not currently on any cholesterol-lowering medication.  He is on amlodipine  and olmesartan  for hypertension management.  He experiences back pain and has had episodes of chest pain, though these have been less frequent recently. No current chest pain. He has not undergone a cardiology workup for these symptoms.     Past Medical History:  Diagnosis Date   Blood per rectum 11/07/2018   Blurry vision 02/03/2016   Broken tooth 06/23/2022   Depression    Fever, unspecified 07/29/2015   Headache 07/07/2022   HIV disease (HCC) 02/24/2015   HIV infection (HCC)    Hyperlipidemia 06/23/2022   Hypertension    Low testosterone  02/24/2015   Lower back pain 07/29/2015   Myalgia 07/23/2021   Neck pain 12/21/2021   Obesity 04/29/2021   Seasonal allergies 07/07/2022   Smoker 01/30/2020   Tinea pedis 07/29/2015   Vaccine counseling 07/07/2022    No past surgical history on file.  No family history on file.    Social History   Socioeconomic History   Marital status: Legally Separated    Spouse name: Not on file   Number of children: Not on file   Years of education: Not on file   Highest education level: Not on file  Occupational History   Not on file  Tobacco Use   Smoking status: Every Day     Current packs/day: 0.25    Types: Cigarettes   Smokeless tobacco: Never   Tobacco comments:    pt. not ready to quit smoking at this timeA1/2PPD; cutting back  Substance and Sexual Activity   Alcohol use: Yes    Alcohol/week: 3.0 standard drinks of alcohol    Types: 3 Standard drinks or equivalent per week    Comment: socially   Drug use: No   Sexual activity: Yes    Comment: declined condoms  Other Topics Concern   Not on file  Social History Narrative   Not on file   Social Drivers of Health   Financial Resource Strain: Not on file  Food Insecurity: Not on file  Transportation Needs: Not on file  Physical Activity: Not on file  Stress: Not on file  Social Connections: Not on file    No Known Allergies   Current Outpatient Medications:    rosuvastatin (CRESTOR) 10 MG tablet, Take 1 tablet (10 mg total) by mouth daily., Disp: 30 tablet, Rfl: 11   amLODipine -olmesartan  (AZOR ) 5-40 MG tablet, Take 1 tablet by mouth daily., Disp: 30 tablet, Rfl: 11   bictegravir-emtricitabine -tenofovir  AF (BIKTARVY ) 50-200-25 MG TABS tablet, Take 1 tablet by mouth daily for 7 days., Disp: , Rfl:    bictegravir-emtricitabine -tenofovir  AF (BIKTARVY ) 50-200-25 MG TABS tablet, Take 1 tablet by mouth daily., Disp: 30 tablet, Rfl: 11  Review of Systems  Constitutional:  Negative for activity change, appetite change, chills, diaphoresis, fatigue, fever and unexpected weight change.  HENT:  Negative for congestion, rhinorrhea, sinus pressure, sneezing, sore throat and trouble swallowing.   Eyes:  Negative for photophobia and visual disturbance.  Respiratory:  Negative for cough, chest tightness, shortness of breath, wheezing and stridor.   Cardiovascular:  Negative for chest pain, palpitations and leg swelling.  Gastrointestinal:  Negative for abdominal distention, abdominal pain, anal bleeding, blood in stool, constipation, diarrhea, nausea and vomiting.  Genitourinary:  Negative for difficulty  urinating, dysuria, flank pain and hematuria.  Musculoskeletal:  Negative for arthralgias, back pain, gait problem, joint swelling and myalgias.  Skin:  Negative for color change, pallor, rash and wound.  Neurological:  Negative for dizziness, tremors, weakness and light-headedness.  Hematological:  Negative for adenopathy. Does not bruise/bleed easily.  Psychiatric/Behavioral:  Negative for agitation, behavioral problems, confusion, decreased concentration, dysphoric mood and sleep disturbance.        Objective:   Physical Exam Constitutional:      Appearance: He is well-developed.  HENT:     Head: Normocephalic and atraumatic.  Eyes:     Conjunctiva/sclera: Conjunctivae normal.  Cardiovascular:     Rate and Rhythm: Normal rate and regular rhythm.  Pulmonary:     Effort: Pulmonary effort is normal. No respiratory distress.     Breath sounds: No wheezing.  Abdominal:     General: There is no distension.     Palpations: Abdomen is soft.  Musculoskeletal:        General: No tenderness. Normal range of motion.     Cervical back: Normal range of motion and neck supple.  Skin:    General: Skin is warm and dry.     Coloration: Skin is not pale.     Findings: No erythema or rash.  Neurological:     General: No focal deficit present.     Mental Status: He is alert and oriented to person, place, and time.  Psychiatric:        Mood and Affect: Mood normal.        Behavior: Behavior normal.        Thought Content: Thought content normal.        Judgment: Judgment normal.           Assessment & Plan:   HIV infection, asymptomatic HIV well-controlled, viral load <20, CD4 count 799, stable for 19 years. - Continue Biktarvy  as prescribed. --checking HIV RNA, CD4 and routine labs today  Atypical Chest pain Intermittent chest pain, Referred to Cardiology but I do not believe he ever went to see them He has been without symptoms but if they recur I can refer  again  Hyperlipidemia Elevated LDL, previous pitavastatin  attempt unsuccessful, Crestor chosen to reduce cardiovascular risk. - Prescribe Crestor (rosuvastatin) 10 mg daily.  Hypertension Managed with amlodipine  and olmesartan .  Back pain Chronic back pain persists  Vaccine counseling: recommended and he received DTaP

## 2024-03-21 ENCOUNTER — Other Ambulatory Visit: Payer: Self-pay

## 2024-03-21 ENCOUNTER — Ambulatory Visit (INDEPENDENT_AMBULATORY_CARE_PROVIDER_SITE_OTHER): Payer: Self-pay | Admitting: Infectious Disease

## 2024-03-21 ENCOUNTER — Encounter: Payer: Self-pay | Admitting: Infectious Disease

## 2024-03-21 VITALS — BP 146/78 | HR 80 | Temp 97.8°F | Ht 68.0 in | Wt 218.0 lb

## 2024-03-21 DIAGNOSIS — M549 Dorsalgia, unspecified: Secondary | ICD-10-CM

## 2024-03-21 DIAGNOSIS — I10 Essential (primary) hypertension: Secondary | ICD-10-CM

## 2024-03-21 DIAGNOSIS — B20 Human immunodeficiency virus [HIV] disease: Secondary | ICD-10-CM

## 2024-03-21 DIAGNOSIS — E785 Hyperlipidemia, unspecified: Secondary | ICD-10-CM

## 2024-03-21 DIAGNOSIS — Z21 Asymptomatic human immunodeficiency virus [HIV] infection status: Secondary | ICD-10-CM

## 2024-03-21 DIAGNOSIS — E782 Mixed hyperlipidemia: Secondary | ICD-10-CM

## 2024-03-21 DIAGNOSIS — M545 Low back pain, unspecified: Secondary | ICD-10-CM

## 2024-03-21 DIAGNOSIS — Z23 Encounter for immunization: Secondary | ICD-10-CM

## 2024-03-21 DIAGNOSIS — R0789 Other chest pain: Secondary | ICD-10-CM

## 2024-03-21 DIAGNOSIS — Z7185 Encounter for immunization safety counseling: Secondary | ICD-10-CM

## 2024-03-21 DIAGNOSIS — G8929 Other chronic pain: Secondary | ICD-10-CM

## 2024-03-21 MED ORDER — AMLODIPINE-OLMESARTAN 5-40 MG PO TABS: 1.0000 | ORAL_TABLET | Freq: Every day | ORAL | 11 refills | Status: DC

## 2024-03-21 MED ORDER — BIKTARVY 50-200-25 MG PO TABS: 1.0000 | ORAL_TABLET | Freq: Every day | ORAL | 11 refills | Status: DC

## 2024-03-21 MED ORDER — ROSUVASTATIN CALCIUM 10 MG PO TABS: 10.0000 mg | ORAL_TABLET | Freq: Every day | ORAL | 11 refills | Status: DC

## 2024-03-22 LAB — URINE CYTOLOGY ANCILLARY ONLY
Chlamydia: NEGATIVE
Comment: NEGATIVE
Comment: NORMAL
Neisseria Gonorrhea: NEGATIVE

## 2024-03-22 LAB — T-HELPER CELLS (CD4) COUNT (NOT AT ARMC)
CD4 % Helper T Cell: 29 % — ABNORMAL LOW (ref 33–65)
CD4 T Cell Abs: 889 /uL (ref 400–1790)

## 2024-03-23 LAB — COMPLETE METABOLIC PANEL WITHOUT GFR
AG Ratio: 1.2 (calc) (ref 1.0–2.5)
ALT: 18 U/L (ref 9–46)
AST: 16 U/L (ref 10–35)
Albumin: 3.9 g/dL (ref 3.6–5.1)
Alkaline phosphatase (APISO): 99 U/L (ref 35–144)
BUN: 13 mg/dL (ref 7–25)
CO2: 24 mmol/L (ref 20–32)
Calcium: 8.6 mg/dL (ref 8.6–10.3)
Chloride: 108 mmol/L (ref 98–110)
Creat: 0.97 mg/dL (ref 0.70–1.30)
Globulin: 3.2 g/dL (ref 1.9–3.7)
Glucose, Bld: 119 mg/dL — ABNORMAL HIGH (ref 65–99)
Potassium: 4 mmol/L (ref 3.5–5.3)
Sodium: 140 mmol/L (ref 135–146)
Total Bilirubin: 0.8 mg/dL (ref 0.2–1.2)
Total Protein: 7.1 g/dL (ref 6.1–8.1)

## 2024-03-23 LAB — CBC WITH DIFFERENTIAL/PLATELET
Absolute Lymphocytes: 3367 {cells}/uL (ref 850–3900)
Absolute Monocytes: 632 {cells}/uL (ref 200–950)
Basophils Absolute: 62 {cells}/uL (ref 0–200)
Basophils Relative: 1 %
Eosinophils Absolute: 68 {cells}/uL (ref 15–500)
Eosinophils Relative: 1.1 %
HCT: 42.8 % (ref 38.5–50.0)
Hemoglobin: 14.3 g/dL (ref 13.2–17.1)
MCH: 33.6 pg — ABNORMAL HIGH (ref 27.0–33.0)
MCHC: 33.4 g/dL (ref 32.0–36.0)
MCV: 100.5 fL — ABNORMAL HIGH (ref 80.0–100.0)
MPV: 9.4 fL (ref 7.5–12.5)
Monocytes Relative: 10.2 %
Neutro Abs: 2071 {cells}/uL (ref 1500–7800)
Neutrophils Relative %: 33.4 %
Platelets: 242 10*3/uL (ref 140–400)
RBC: 4.26 10*6/uL (ref 4.20–5.80)
RDW: 12.2 % (ref 11.0–15.0)
Total Lymphocyte: 54.3 %
WBC: 6.2 10*3/uL (ref 3.8–10.8)

## 2024-03-23 LAB — HEMOGLOBIN A1C
Hgb A1c MFr Bld: 5.6 % (ref <5.7)
Mean Plasma Glucose: 114 mg/dL
eAG (mmol/L): 6.3 mmol/L

## 2024-03-23 LAB — HIV-1 RNA QUANT-NO REFLEX-BLD
HIV 1 RNA Quant: NOT DETECTED {copies}/mL
HIV-1 RNA Quant, Log: NOT DETECTED {Log_copies}/mL

## 2024-03-23 LAB — LIPID PANEL
Cholesterol: 199 mg/dL (ref <200)
HDL: 68 mg/dL (ref >=40)
LDL Cholesterol (Calc): 105 mg/dL — ABNORMAL HIGH
Non-HDL Cholesterol (Calc): 131 mg/dL — ABNORMAL HIGH (ref <130)
Total CHOL/HDL Ratio: 2.9 (calc) (ref <5.0)
Triglycerides: 144 mg/dL (ref <150)

## 2024-03-23 LAB — RPR: RPR Ser Ql: NONREACTIVE

## 2024-04-09 ENCOUNTER — Encounter: Payer: Self-pay | Admitting: *Deleted

## 2024-05-01 ENCOUNTER — Ambulatory Visit: Payer: Self-pay

## 2024-05-03 ENCOUNTER — Ambulatory Visit: Payer: Self-pay

## 2024-08-26 ENCOUNTER — Other Ambulatory Visit: Payer: Self-pay | Admitting: Infectious Disease

## 2024-09-30 NOTE — Progress Notes (Unsigned)
 Chief complaint: follow-up for HIV disease on medications   Subjective    Patient ID: [name], male    DOB: Sep 25, 1971, 53 y.o.   MRN: [id number]  HPI  Discussed the use of AI scribe software for clinical note transcription with the patient, who gave verbal consent to proceed.  History of Present Illness   [name] is a 53 year old male with hypertension and HIV who presents for follow-up care.  He is currently on Biktarvy  for HIV management. He has enough medication for two months and is planning a trip to [place].  He is on rosuvastatin  for hyperlipidemia and takes amlodipine  and olmesartan  for hypertension. He faces challenges with insurance coverage for his blood pressure medications, leading to difficulties in obtaining them. I double checked the combination regimen he is rx and it IS on [facility] but I will send in separate scripts of generics and point out it is on [facility].  He experienced chest pain in the past and was referred to cardiology, but does not recall seeing a cardiologist recently. He has occasional chest discomfort when hungry, which resolves after eating.  He has been screened for diabetes and does not have the condition.       Past Medical History:  Diagnosis Date   Blood per rectum 11/07/2018   Blurry vision 02/03/2016   Broken tooth 06/23/2022   Depression    Fever, unspecified 07/29/2015   Headache 07/07/2022   HIV disease (HCC) 02/24/2015   HIV infection (HCC)    Hyperlipidemia 06/23/2022   Hypertension    Low testosterone  02/24/2015   Lower back pain 07/29/2015   Myalgia 07/23/2021   Neck pain 12/21/2021   Obesity 04/29/2021   Seasonal allergies 07/07/2022   Smoker 01/30/2020   Tinea pedis 07/29/2015   Vaccine counseling 07/07/2022    No past surgical history on file.  No family history on file.    Social History   Socioeconomic History   Marital status: Legally Separated    Spouse name: Not on file   Number of children: Not on file   Years of  education: Not on file   Highest education level: Not on file  Occupational History   Not on file  Tobacco Use   Smoking status: Every Day    Current packs/day: 0.25    Types: Cigarettes   Smokeless tobacco: Never   Tobacco comments:    pt. not ready to quit smoking at this timeA1/2PPD; cutting back  Substance and Sexual Activity   Alcohol use: Yes    Alcohol/week: 3.0 standard drinks of alcohol    Types: 3 Standard drinks or equivalent per week    Comment: socially   Drug use: No   Sexual activity: Yes    Comment: declined condoms  Other Topics Concern   Not on file  Social History Narrative   Not on file   Social Drivers of Health   Tobacco Use: High Risk (03/21/2024)   Patient History    Smoking Tobacco Use: Every Day    Smokeless Tobacco Use: Never    Passive Exposure: Not on file  Financial Resource Strain: Not on file  Food Insecurity: Not on file  Transportation Needs: Not on file  Physical Activity: Not on file  Stress: Not on file  Social Connections: Not on file  Depression (PHQ2-9): Low Risk (08/18/2023)   Depression (PHQ2-9)    PHQ-2 Score: 1  Alcohol Screen: Not on file  Housing: Not on file  Utilities:  Not on file  Health Literacy: Not on file    Allergies[1]  Current Medications[2]    Review of Systems  Constitutional:  Negative for activity change, appetite change, chills, diaphoresis, fatigue, fever and unexpected weight change.  HENT:  Negative for congestion, rhinorrhea, sinus pressure, sneezing, sore throat and trouble swallowing.   Eyes:  Negative for photophobia and visual disturbance.  Respiratory:  Negative for cough, chest tightness, shortness of breath, wheezing and stridor.   Cardiovascular:  Negative for chest pain, palpitations and leg swelling.  Gastrointestinal:  Negative for abdominal distention, abdominal pain, anal bleeding, blood in stool, constipation, diarrhea, nausea and vomiting.  Genitourinary:  Negative for difficulty  urinating, dysuria, flank pain and hematuria.  Musculoskeletal:  Negative for arthralgias, back pain, gait problem, joint swelling and myalgias.  Skin:  Negative for color change, pallor, rash and wound.  Neurological:  Negative for dizziness, tremors, weakness and light-headedness.  Hematological:  Negative for adenopathy. Does not bruise/bleed easily.  Psychiatric/Behavioral:  Negative for agitation, behavioral problems, confusion, decreased concentration, dysphoric mood and sleep disturbance.        Objective:   Physical Exam Constitutional:      Appearance: He is well-developed.  HENT:     Head: Normocephalic and atraumatic.  Eyes:     Conjunctiva/sclera: Conjunctivae normal.  Cardiovascular:     Rate and Rhythm: Normal rate and regular rhythm.  Pulmonary:     Effort: Pulmonary effort is normal. No respiratory distress.     Breath sounds: No wheezing.  Abdominal:     General: There is no distension.     Palpations: Abdomen is soft.  Musculoskeletal:        General: No tenderness. Normal range of motion.     Cervical back: Normal range of motion and neck supple.  Skin:    General: Skin is warm and dry.     Coloration: Skin is not pale.     Findings: No erythema or rash.  Neurological:     General: No focal deficit present.     Mental Status: He is alert and oriented to person, place, and time.  Psychiatric:        Mood and Affect: Mood normal.        Behavior: Behavior normal.        Thought Content: Thought content normal.        Judgment: Judgment normal.           Assessment & Plan:   Assessment and Plan    HIV disease Managed with Biktarvy . - Provided one month supply of Biktarvy  to ensure he has 2 months --check HIV RNA, CD4 and routine labs   Hypertension Managed with amlodipine  and olmesartan . Lack of competence on part of [facility] at [facility] maybe the issue re  HMAP program may cause treatment gaps. -sent 2 scripts for generics to [facility] on  [address]   If they continue to mess th is up will send to [facility] mail order and hope they are better  Mixed hyperlipidemia --continue crestor    Vaccine counseling: recommended and he received COVID and flu shots today.          [1] No Known Allergies [2]  Current Outpatient Medications:    amLODipine -olmesartan  (AZOR ) 5-40 MG tablet, Take 1 tablet by mouth daily., Disp: 30 tablet, Rfl: 11   bictegravir-emtricitabine -tenofovir  AF (BIKTARVY ) 50-200-25 MG TABS tablet, Take 1 tablet by mouth daily for 7 days., Disp: , Rfl:    bictegravir-emtricitabine -tenofovir  AF (BIKTARVY ) 50-200-25 MG TABS  tablet, Take 1 tablet by mouth daily., Disp: 30 tablet, Rfl: 11   rosuvastatin  (CRESTOR ) 10 MG tablet, Take 1 tablet (10 mg total) by mouth daily., Disp: 30 tablet, Rfl: 11

## 2024-10-01 ENCOUNTER — Encounter: Payer: Self-pay | Admitting: Infectious Disease

## 2024-10-01 ENCOUNTER — Other Ambulatory Visit: Payer: Self-pay

## 2024-10-01 ENCOUNTER — Ambulatory Visit: Payer: Self-pay | Admitting: Infectious Disease

## 2024-10-01 VITALS — BP 161/90 | HR 80 | Temp 97.8°F | Ht 68.0 in | Wt 220.0 lb

## 2024-10-01 DIAGNOSIS — E782 Mixed hyperlipidemia: Secondary | ICD-10-CM

## 2024-10-01 DIAGNOSIS — Z23 Encounter for immunization: Secondary | ICD-10-CM

## 2024-10-01 DIAGNOSIS — Z7185 Encounter for immunization safety counseling: Secondary | ICD-10-CM

## 2024-10-01 DIAGNOSIS — I10 Essential (primary) hypertension: Secondary | ICD-10-CM

## 2024-10-01 DIAGNOSIS — I160 Hypertensive urgency: Secondary | ICD-10-CM

## 2024-10-01 DIAGNOSIS — B20 Human immunodeficiency virus [HIV] disease: Secondary | ICD-10-CM

## 2024-10-01 DIAGNOSIS — Z79899 Other long term (current) drug therapy: Secondary | ICD-10-CM

## 2024-10-01 MED ORDER — ROSUVASTATIN CALCIUM 10 MG PO TABS: 10.0000 mg | ORAL_TABLET | Freq: Every day | ORAL | 11 refills | Status: AC

## 2024-10-01 MED ORDER — AMLODIPINE BESYLATE 5 MG PO TABS: 5.0000 mg | ORAL_TABLET | Freq: Every day | ORAL | 11 refills | Status: AC

## 2024-10-01 MED ORDER — BIKTARVY 50-200-25 MG PO TABS: 1.0000 | ORAL_TABLET | Freq: Every day | ORAL | 11 refills | Status: AC

## 2024-10-01 MED ORDER — OLMESARTAN MEDOXOMIL 40 MG PO TABS: 40.0000 mg | ORAL_TABLET | Freq: Every day | ORAL | 11 refills | Status: AC

## 2024-10-01 NOTE — Addendum Note (Signed)
 Addended by: FLEETA KATHIE JOMARIE LOISE on: 10/01/2024 04:42 PM   Modules accepted: Level of Service

## 2024-10-02 ENCOUNTER — Other Ambulatory Visit: Payer: Self-pay | Admitting: Pharmacist

## 2024-10-02 DIAGNOSIS — B20 Human immunodeficiency virus [HIV] disease: Secondary | ICD-10-CM

## 2024-10-02 LAB — T-HELPER CELLS (CD4) COUNT (NOT AT ARMC)
CD4 % Helper T Cell: 30 % — ABNORMAL LOW (ref 33–65)
CD4 T Cell Abs: 749 /uL (ref 400–1790)

## 2024-10-02 MED ORDER — BIKTARVY 50-200-25 MG PO TABS: 1.0000 | ORAL_TABLET | Freq: Every day | ORAL | 0 refills | Status: AC

## 2024-10-02 NOTE — Progress Notes (Signed)
 Medication Samples have been provided to the patient.  Drug name: Biktarvy         Strength: 50/200/25 mg       Qty: 4 bottles (28 tablets)   LOT: [id number]   Exp.Date: 11/17/26  Samples requested by Dr. Fleeta Rothman.  Dosing instructions: Take one tablet by mouth once daily  The patient has been instructed regarding the correct time, dose, and frequency of taking this medication, including desired effects and most common side effects.   Coleen Cardiff L. Kirin Brandenburger, PharmD, BCIDP, AAHIVP, CPP Clinical Pharmacist Practitioner Infectious Diseases Clinical Pharmacist [facility]

## 2024-10-03 LAB — CBC WITH DIFFERENTIAL/PLATELET
Absolute Lymphocytes: 2854 {cells}/uL (ref 850–3900)
Absolute Monocytes: 435 {cells}/uL (ref 200–950)
Basophils Absolute: 52 {cells}/uL (ref 0–200)
Basophils Relative: 0.9 %
Eosinophils Absolute: 58 {cells}/uL (ref 15–500)
Eosinophils Relative: 1 %
HCT: 42.5 % (ref 39.4–51.1)
Hemoglobin: 14.8 g/dL (ref 13.2–17.1)
MCH: 34.8 pg — ABNORMAL HIGH (ref 27.0–33.0)
MCHC: 34.8 g/dL (ref 31.6–35.4)
MCV: 100 fL (ref 81.4–101.7)
MPV: 9.6 fL (ref 7.5–12.5)
Monocytes Relative: 7.5 %
Neutro Abs: 2401 {cells}/uL (ref 1500–7800)
Neutrophils Relative %: 41.4 %
Platelets: 226 Thousand/uL (ref 140–400)
RBC: 4.25 Million/uL (ref 4.20–5.80)
RDW: 12.2 % (ref 11.0–15.0)
Total Lymphocyte: 49.2 %
WBC: 5.8 Thousand/uL (ref 3.8–10.8)

## 2024-10-03 LAB — COMPLETE METABOLIC PANEL WITHOUT GFR
AG Ratio: 1.2 (calc) (ref 1.0–2.5)
ALT: 35 U/L (ref 9–46)
AST: 31 U/L (ref 10–35)
Albumin: 3.9 g/dL (ref 3.6–5.1)
Alkaline phosphatase (APISO): 107 U/L (ref 35–144)
BUN: 9 mg/dL (ref 7–25)
CO2: 24 mmol/L (ref 20–32)
Calcium: 8.6 mg/dL (ref 8.6–10.3)
Chloride: 106 mmol/L (ref 98–110)
Creat: 1.03 mg/dL (ref 0.70–1.30)
Globulin: 3.2 g/dL (ref 1.9–3.7)
Glucose, Bld: 111 mg/dL — ABNORMAL HIGH (ref 65–99)
Potassium: 3.5 mmol/L (ref 3.5–5.3)
Sodium: 141 mmol/L (ref 135–146)
Total Bilirubin: 0.9 mg/dL (ref 0.2–1.2)
Total Protein: 7.1 g/dL (ref 6.1–8.1)

## 2024-10-03 LAB — LIPID PANEL
Cholesterol: 158 mg/dL (ref <200)
HDL: 57 mg/dL (ref >=40)
LDL Cholesterol (Calc): 67 mg/dL
Non-HDL Cholesterol (Calc): 101 mg/dL (ref <130)
Total CHOL/HDL Ratio: 2.8 (calc) (ref <5.0)
Triglycerides: 242 mg/dL — ABNORMAL HIGH (ref <150)

## 2024-10-03 LAB — SYPHILIS: RPR W/REFLEX TO RPR TITER AND TREPONEMAL ANTIBODIES, TRADITIONAL SCREENING AND DIAGNOSIS ALGORITHM: RPR Ser Ql: NONREACTIVE

## 2024-10-03 LAB — HIV-1 RNA QUANT-NO REFLEX-BLD
HIV 1 RNA Quant: NOT DETECTED {copies}/mL
HIV-1 RNA Quant, Log: NOT DETECTED {Log_copies}/mL

## 2024-10-03 LAB — URINE CYTOLOGY ANCILLARY ONLY
Chlamydia: NEGATIVE
Comment: NEGATIVE
Comment: NORMAL
Neisseria Gonorrhea: NEGATIVE

## 2025-04-01 ENCOUNTER — Ambulatory Visit: Payer: Self-pay | Admitting: Infectious Disease
